# Patient Record
Sex: Male | Born: 1985 | Race: White | Hispanic: No | Marital: Married | Smoking: Never smoker
Health system: Southern US, Community
[De-identification: ages and names within clinical notes are randomized; demographics above are authoritative.]

## PROBLEM LIST (undated history)

## (undated) DIAGNOSIS — T7840XA Allergy, unspecified, initial encounter: Secondary | ICD-10-CM

## (undated) HISTORY — PX: VASECTOMY: SHX75

## (undated) HISTORY — DX: Allergy, unspecified, initial encounter: T78.40XA

## (undated) HISTORY — PX: HERNIA REPAIR: SHX51

---
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
## 2003-03-26 ENCOUNTER — Emergency Department (HOSPITAL_COMMUNITY): Admission: EM | Admit: 2003-03-26 | Discharge: 2003-03-27 | Payer: Self-pay

## 2003-03-27 ENCOUNTER — Encounter: Payer: Self-pay | Admitting: Emergency Medicine

## 2003-07-09 ENCOUNTER — Encounter: Admission: RE | Admit: 2003-07-09 | Discharge: 2003-07-09 | Payer: Self-pay | Admitting: Family Medicine

## 2003-07-09 ENCOUNTER — Encounter: Payer: Self-pay | Admitting: Family Medicine

## 2005-04-11 ENCOUNTER — Encounter: Admission: RE | Admit: 2005-04-11 | Discharge: 2005-04-11 | Payer: Self-pay | Admitting: Family Medicine

## 2005-08-31 ENCOUNTER — Encounter: Admission: RE | Admit: 2005-08-31 | Discharge: 2005-08-31 | Payer: Self-pay | Admitting: Family Medicine

## 2006-09-24 ENCOUNTER — Emergency Department (HOSPITAL_COMMUNITY): Admission: EM | Admit: 2006-09-24 | Discharge: 2006-09-24 | Payer: Self-pay | Admitting: Emergency Medicine

## 2007-08-18 ENCOUNTER — Encounter: Admission: RE | Admit: 2007-08-18 | Discharge: 2007-08-18 | Payer: Self-pay | Admitting: Family Medicine

## 2008-09-09 ENCOUNTER — Emergency Department (HOSPITAL_COMMUNITY): Admission: EM | Admit: 2008-09-09 | Discharge: 2008-09-10 | Payer: Self-pay | Admitting: Internal Medicine

## 2008-09-19 ENCOUNTER — Emergency Department (HOSPITAL_BASED_OUTPATIENT_CLINIC_OR_DEPARTMENT_OTHER): Admission: EM | Admit: 2008-09-19 | Discharge: 2008-09-19 | Payer: Self-pay | Admitting: Emergency Medicine

## 2008-11-22 ENCOUNTER — Emergency Department (HOSPITAL_COMMUNITY): Admission: EM | Admit: 2008-11-22 | Discharge: 2008-11-22 | Payer: Self-pay | Admitting: Emergency Medicine

## 2009-11-08 ENCOUNTER — Encounter: Admission: RE | Admit: 2009-11-08 | Discharge: 2009-11-08 | Payer: Self-pay | Admitting: Surgery

## 2010-12-24 ENCOUNTER — Encounter: Payer: Self-pay | Admitting: Surgery

## 2011-09-04 LAB — POCT I-STAT, CHEM 8
BUN: 14
Calcium, Ion: 1.2
TCO2: 27

## 2011-09-04 LAB — DIFFERENTIAL
Basophils Absolute: 0
Basophils Relative: 0
Eosinophils Relative: 2
Monocytes Absolute: 0.5

## 2011-09-04 LAB — COMPREHENSIVE METABOLIC PANEL
ALT: 11
AST: 21
Albumin: 4.2
Alkaline Phosphatase: 50
Chloride: 108
GFR calc Af Amer: >60
Potassium: 3.9
Total Bilirubin: 0.6

## 2011-09-04 LAB — URINALYSIS, ROUTINE W REFLEX MICROSCOPIC
Bilirubin Urine: NEGATIVE
Glucose, UA: NEGATIVE
Hgb urine dipstick: NEGATIVE
Ketones, ur: NEGATIVE
Ketones, ur: NEGATIVE
Leukocytes, UA: NEGATIVE
Protein, ur: NEGATIVE
Protein, ur: NEGATIVE
Urobilinogen, UA: 1

## 2011-09-04 LAB — URINE CULTURE
Colony Count: NO GROWTH
Culture: NO GROWTH

## 2011-09-04 LAB — CBC
Platelets: 168
WBC: 7.6

## 2011-09-04 LAB — URINE MICROSCOPIC-ADD ON

## 2014-03-12 ENCOUNTER — Other Ambulatory Visit: Payer: Self-pay | Admitting: Family Medicine

## 2014-03-12 DIAGNOSIS — N50811 Right testicular pain: Secondary | ICD-10-CM

## 2014-03-18 ENCOUNTER — Other Ambulatory Visit: Payer: Self-pay | Admitting: Family Medicine

## 2014-03-18 ENCOUNTER — Ambulatory Visit
Admission: RE | Admit: 2014-03-18 | Discharge: 2014-03-18 | Disposition: A | Discharge status: Home / Self Care | Payer: 59 | Source: Ambulatory Visit | Attending: Family Medicine | Admitting: Family Medicine

## 2014-03-18 DIAGNOSIS — N50811 Right testicular pain: Secondary | ICD-10-CM

## 2015-03-31 ENCOUNTER — Other Ambulatory Visit (HOSPITAL_COMMUNITY): Payer: Self-pay | Admitting: Orthopedic Surgery

## 2015-03-31 ENCOUNTER — Ambulatory Visit (HOSPITAL_COMMUNITY)
Admission: RE | Admit: 2015-03-31 | Discharge: 2015-03-31 | Disposition: A | Discharge status: Home / Self Care | Payer: 59 | Source: Ambulatory Visit | Attending: Orthopedic Surgery | Admitting: Orthopedic Surgery

## 2015-03-31 DIAGNOSIS — Z01818 Encounter for other preprocedural examination: Secondary | ICD-10-CM | POA: Diagnosis not present

## 2015-03-31 DIAGNOSIS — M25512 Pain in left shoulder: Secondary | ICD-10-CM

## 2016-01-31 ENCOUNTER — Other Ambulatory Visit: Payer: Self-pay | Admitting: Family Medicine

## 2016-01-31 ENCOUNTER — Ambulatory Visit
Admission: RE | Admit: 2016-01-31 | Discharge: 2016-01-31 | Disposition: A | Discharge status: Home / Self Care | Payer: 59 | Source: Ambulatory Visit | Attending: Family Medicine | Admitting: Family Medicine

## 2016-01-31 DIAGNOSIS — R059 Cough, unspecified: Secondary | ICD-10-CM

## 2016-01-31 DIAGNOSIS — R05 Cough: Secondary | ICD-10-CM

## 2016-08-24 ENCOUNTER — Ambulatory Visit (INDEPENDENT_AMBULATORY_CARE_PROVIDER_SITE_OTHER): Payer: 59 | Admitting: Family Medicine

## 2016-08-24 ENCOUNTER — Ambulatory Visit (INDEPENDENT_AMBULATORY_CARE_PROVIDER_SITE_OTHER): Payer: 59

## 2016-08-24 ENCOUNTER — Encounter: Payer: Self-pay | Admitting: Family Medicine

## 2016-08-24 VITALS — BP 128/86 | HR 85 | Ht 71.0 in | Wt 195.4 lb

## 2016-08-24 DIAGNOSIS — M25521 Pain in right elbow: Secondary | ICD-10-CM | POA: Diagnosis not present

## 2016-08-24 DIAGNOSIS — M7021 Olecranon bursitis, right elbow: Secondary | ICD-10-CM | POA: Diagnosis not present

## 2016-08-24 MED ORDER — PREDNISONE 10 MG PO TABS: ORAL_TABLET | ORAL | 0 refills | Status: DC

## 2016-08-24 NOTE — Progress Notes (Signed)
Subjective:  Patient ID: Derrick Krueger, male    DOB: 12/27/1985  Age: 30 y.o. MRN: [id number]  CC: New Patient (Initial Visit) (pt here for elbow pain for the past few months, he has tried otc anti-inflammatories, rest and ice but nothing really works. 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 He has never had any injury to the right elbow.)   HPI Derrick Krueger presents for Patient has been working out trying to condition himself for a new position. He is concerned that this pain may make it difficult for him to perform at the level required of the new position. He continues to have pain and points to the right olecranon region. No relief with over-the-counter anti-inflammatories. No relief with icing it. He has no known injury. He does not do a lot of of extension against resistance as part of his job or his workouts.  History Derrick Krueger has a past medical history of Allergy.   He has a past surgical history that includes Hernia repair.   His family history is not on file.He reports that he has never smoked. He has never used smokeless tobacco. He reports that he drinks alcohol. He reports that he does not use drugs.  No current outpatient prescriptions on file prior to visit.   No current facility-administered medications on file prior to visit.     ROS Review of Systems  Constitutional: Negative for chills, diaphoresis and fever.  HENT: Negative for rhinorrhea and sore throat.   Respiratory: Negative for cough and shortness of breath.   Cardiovascular: Negative for chest pain.  Gastrointestinal: Negative for abdominal pain.  Musculoskeletal: Positive for arthralgias and joint swelling. Negative for myalgias.  Skin: Negative for rash.  Neurological: Negative for weakness and headaches.    Objective:  BP 128/86   Pulse 85   Ht 5\' 11"  (1.803 m)   Wt 195 lb 6 oz (88.6 kg)   BMI 27.25 kg/m   Physical Exam  Constitutional: He is oriented to person, place, and time.  He appears well-developed and well-nourished.  HENT:  Head: Normocephalic and atraumatic.  Right Ear: Tympanic membrane and external ear normal. No decreased hearing is noted.  Left Ear: Tympanic membrane and external ear normal. No decreased hearing is noted.  Mouth/Throat: No oropharyngeal exudate or posterior oropharyngeal erythema.  Eyes: Pupils are equal, round, and reactive to light.  Neck: Normal range of motion. Neck supple.  Cardiovascular: Normal rate and regular rhythm.   No murmur heard. Pulmonary/Chest: Breath sounds normal. No respiratory distress.  Abdominal: Soft. Bowel sounds are normal. He exhibits no mass. There is no tenderness.  Musculoskeletal: Normal range of motion. He exhibits tenderness (tenderness at the medial aspect of the olecranon on the right. Tender for compression of medial triceps insertion.).  Neurological: He is alert and oriented to person, place, and time.  Vitals reviewed.   Assessment & Plan:   Derrick Krueger was seen today for new patient (initial visit).  Diagnoses and all orders for this visit:  Bursitis of elbow, right -     Ambulatory referral to Physical Therapy  Elbow pain, right -     DG Elbow 2 Views Right; Future  Other orders -     predniSONE (DELTASONE) 10 MG tablet; Take 5 daily for 3 days followed by 4,3,2 and 1 for 3 days each.   I am having Derrick Krueger start on predniSONE.  Meds ordered this encounter  Medications  . predniSONE (DELTASONE) 10  MG tablet    Sig: Take 5 daily for 3 days followed by 4,3,2 and 1 for 3 days each.    Dispense:  45 tablet    Refill:  0   X-ray shows no evidence for fracture. No arthritis. Exam is not consistent with tear  Follow-up: Return in about 6 weeks (around 10/05/2016) for Elbow and wellness.  Mechele ClaudeWarren Kirsten Mckone, M.D.

## 2016-09-04 ENCOUNTER — Ambulatory Visit: Payer: 59 | Attending: Family Medicine | Admitting: Physical Therapy

## 2016-09-04 DIAGNOSIS — M25521 Pain in right elbow: Secondary | ICD-10-CM | POA: Insufficient documentation

## 2016-09-04 NOTE — Therapy (Signed)
[facility] [address] [place], [place], [address] Phone: [phone]   Fax:  [phone]  Physical Therapy Evaluation  Patient Details  Name: Derrick Downsathan W Bettendorf MRN: [id number] Date of Birth: 12/13/1985 Referring Provider: Mechele ClaudeWarren Stacks MD.  Encounter Date: 09/04/2016      PT End of Session - 09/04/16 1330    Visit Number 1   Number of Visits 12   Date for PT Re-Evaluation 10/16/16   PT Start Time 1120   PT Stop Time 1212   PT Time Calculation (min) 52 min   Activity Tolerance Patient tolerated treatment well   Behavior During Therapy [facility] for tasks assessed/performed      Past Medical History:  Diagnosis Date  . 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 HERNIA REPAIR     pt thinks it was in 2008    There were no vitals filed for this visit.       Subjective Assessment - 09/04/16 1316    Subjective The patient presents to to outpatient physical therapy with c/o right elbow pain that has been ongoing for ~ 2 months. He reports that the pain started after heavy lifting.  He notes increased elbow pain after using his zero turn mower.  His pain is not specifically localized though he is tender near his medial epicondyle.  He reports heat and rest decrease his pain. and lifting/pushinh heavy weights increase his pain.  He reports no cervical injury though he had an MRI to this region years ago.  He also has a h/o of bilateral shoulder pain.  He reports left elbow pain as well though not nearly as painful as the right.  His pain-level at rest today is a 3/10 but it can rise to much higher levels (6-7+/10).   Limitations --  Lifting.   Patient Stated Goals Get out of pain.   Currently in Pain? Yes   Pain Score 3    Pain Location Elbow   Pain Orientation Right   Pain Descriptors / Indicators Aching;Nagging   Pain Type Acute pain   Pain Onset More than a month ago   Pain Frequency Constant   Aggravating Factors  Lifting and pushing heavy weight.   Pain Relieving Factors Heat and rest.            [facility] PT Assessment - 09/04/16 0001      Assessment   Medical Diagnosis Right elbow bursitis.   Referring Provider Mechele ClaudeWarren Stacks MD.   Onset Date/Surgical Date --  2 months.     Precautions   Precautions None     Restrictions   Weight Bearing Restrictions No     Balance Screen   Has the patient fallen in the past 6 months No   Has the patient had a decrease in activity level because of a fear of falling?  No   Is the patient reluctant to leave their home because of a fear of falling?  No     Home Environment   Living Environment Private residence     Prior Function   Level of Independence Independent     Observation/Other Assessments   Observations Negative Tennis elbow test for lateral and medial epicondylitis.  Negative Tinel's test at right medial elbow.     ROM / Strength   AROM / PROM / Strength AROM;Strength     AROM   Overall AROM Comments Full right elbow  and forearm AROM.  Crepitus with passive endrange right elbow flexion.     Strength   Overall Strength Comments Normal right elbow, forearm and grip strength..     Palpation   Palpation comment Tender to palption over flexor tendons at medical epicondylar attachment.  Although he reports rather diffuse pain radiation (not palpably tender) into triceps region and moving distally into right forearm.     Special Tests    Special Tests --  Normal UE DTR's.                   [facility] Adult PT Treatment/Exercise - 09/04/16 0001      Modalities   Modalities Electrical Stimulation;Iontophoresis     Electrical Stimulation   Electrical Stimulation Location IFC around right elbow.   Electrical Stimulation Action IFC at 100% scan.   Electrical Stimulation Goals Pain     Iontophoresis   Type of Iontophoresis Dexamethasone   Location Medial epicondylar region.   Dose  80 mA-Min.                     PT Long Term Goals - 09/04/16 1351      PT LONG TERM GOAL #1   Title Return to normal lifting activites with right elbow pain not to exceed 2/10.   Time 6   Period Weeks   Status New               Plan - 09/04/16 1341    Clinical Impression Statement The patient presents with reproducible right elbow pain with heavy pushing and pulling.  He is tender to palpation in the area of the medial epicondyle and associated muscular attachment.  Special testing is negative.  When his pain increases (ie:  after using his zero turn mower) it is diffuse in nature encompassing his right elbow with pain moving into the right Tricep region and distally over his forearm.   Rehab Potential Good   PT Frequency 2x / week   PT Duration 6 weeks   PT Treatment/Interventions ADLs/Self Care Home Management;Cryotherapy;Electrical Stimulation;Iontophoresis 4mg /ml Dexamethasone;Moist Heat;Ultrasound;Patient/family education;Therapeutic exercise;Therapeutic activities;Dry needling   PT Next Visit Plan Iontophoresis; Dry needling; STW/M including IASTM over medial and lateral elbow region; electrical stimulation.   Consulted and Agree with Plan of Care Patient      Patient will benefit from skilled therapeutic intervention in order to improve the following deficits and impairments:  Pain, Decreased activity tolerance  Visit Diagnosis: Pain in right elbow - Plan: PT plan of care cert/re-cert     Problem List There are no active problems to display for this patient.   Brayant Dorr, [place] MPT+ 09/04/2016, 1:56 PM  [facility] [address] [place], [place], [address] Phone: [phone]   Fax:  [phone]  Name: KOU GUCCIARDO MRN: [id number] Date of Birth: 1986-05-17

## 2016-09-13 ENCOUNTER — Ambulatory Visit: Payer: 59 | Admitting: *Deleted

## 2016-09-13 DIAGNOSIS — M25521 Pain in right elbow: Secondary | ICD-10-CM

## 2016-09-13 NOTE — Therapy (Signed)
[facility] [address] [place], [place], [address] Phone: [phone]   Fax:  [phone]  Physical Therapy Treatment  Patient Details  Name: Derrick Krueger MRN: [id number] Date of Birth: 07-09-1986 Referring Provider: Mechele Claude MD.  Encounter Date: 09/13/2016      PT End of Session - 09/13/16 1733    Visit Number 2   Number of Visits 12   Date for PT Re-Evaluation 10/16/16   PT Start Time 1645   PT Stop Time 1738   PT Time Calculation (min) 53 min      Past Medical History:  Diagnosis Date  . 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 HERNIA REPAIR     pt thinks it was in 2008    There were no vitals filed for this visit.      Subjective Assessment - 09/13/16 1743    Subjective The patient presents to to outpatient physical therapy with c/o right elbow pain that has been ongoing for ~ 2 months. He reports that the pain started after heavy lifting.  He notes increased elbow pain after using his zero turn mower.  His pain is not specifically localized though he is tender near his medial epicondyle.  He reports heat and rest decrease his pain. and lifting/pushinh heavy weights increase his pain.  He reports no cervical injury though he had an MRI to this region years ago.  He also has a h/o of bilateral shoulder pain.  He reports left elbow pain as well though not nearly as painful as the right.  His pain-level at rest today is a 3/10 but it can rise to much higher levels (6-7+/10).   Limitations Lifting   Patient Stated Goals Get out of pain.   Currently in Pain? Yes   Pain Score 4    Pain Location Elbow   Pain Orientation Right   Pain Descriptors / Indicators Aching;Nagging   Pain Type Acute pain   Pain Onset More than a month ago   Pain Frequency Constant                         [facility] Adult PT Treatment/Exercise - 09/13/16 0001      Modalities   Modalities Electrical Stimulation;Iontophoresis;Ultrasound     Programme researcher, broadcasting/film/video Location IFC around right elbow. 80-150hz  x 15 mins   Electrical Stimulation Goals Pain     Ultrasound   Ultrasound Location medial and lateral aspect   Ultrasound Parameters 1.5 w/cm2 x 16 mins ( 8 each side) 3.3 mhz   Ultrasound Goals Pain     Iontophoresis   Type of Iontophoresis Dexamethasone   Location Medial epicondylar region. 2/6   Dose 80 mA-Min.                     PT Long Term Goals - 09/04/16 1351      PT LONG TERM GOAL #1   Title Return to normal lifting activites with right elbow pain not to exceed 2/10.   Time 6   Period Weeks   Status New               Plan - 09/13/16 1734    Clinical Impression Statement Pt  did fairly weel with Rx today. He was having pain in RT elbow on the medial and lateral aspect today. He has pain  with heavy lifting and at rest. No complaints during Rx today and had normal modality responses.   Rehab Potential Good   PT Frequency 2x / week   PT Duration 6 weeks   PT Treatment/Interventions ADLs/Self Care Home Management;Cryotherapy;Electrical Stimulation;Iontophoresis 4mg /ml Dexamethasone;Moist Heat;Ultrasound;Patient/family education;Therapeutic exercise;Therapeutic activities;Dry needling   PT Next Visit Plan Iontophoresis; Dry needling; STW/M including IASTM over medial and lateral elbow region; electrical stimulation.      Patient will benefit from skilled therapeutic intervention in order to improve the following deficits and impairments:  Pain, Decreased activity tolerance  Visit Diagnosis: Pain in right elbow     Problem List There are no active problems to display for this patient.   Elika Godar,CHRIS, PTA 09/13/2016, 5:47 PM  [facility] [address] [place], [place], [address] Phone: [phone]   Fax:  [phone]  Name:  Corky Downsathan W Sehgal MRN: [id number] Date of Birth: 02/26/1986

## 2016-09-18 ENCOUNTER — Ambulatory Visit: Payer: 59 | Admitting: Physical Therapy

## 2016-09-18 DIAGNOSIS — M25521 Pain in right elbow: Secondary | ICD-10-CM

## 2016-09-18 NOTE — Therapy (Signed)
[facility] [address] [place], [place], [address] Phone: [phone]   Fax:  [phone]  Physical Therapy Treatment  Patient Details  Name: Derrick Krueger Aristizabal MRN: [id number] Date of Birth: 03/28/1986 Referring Provider: Mechele ClaudeWarren Stacks MD.  Encounter Date: 09/18/2016      PT End of Session - 09/18/16 1801    Visit Number 3   Number of Visits 12   Date for PT Re-Evaluation 10/16/16   PT Start Time 0443   PT Stop Time 0542   PT Time Calculation (min) 59 min   Activity Tolerance Patient tolerated treatment well   Behavior During Therapy [facility] for tasks assessed/performed      Past Medical History:  Diagnosis Date  . 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 HERNIA REPAIR     pt thinks it was in 2008    There were no vitals filed for this visit.      Subjective Assessment - 09/18/16 1757    Subjective Patient states he has been doing better since his last treatment with a pain-level of 2/10 today.  He also states he has been busy at work 'twisting wrenches."   Pain Score 2    Pain Location Elbow   Pain Orientation Right   Pain Descriptors / Indicators Aching;Nagging   Pain Type Acute pain   Pain Onset More than a month ago                         [facility] Treatment/Exercise - 09/18/16 0001      Modalities   Modalities Electrical Stimulation     Electrical Stimulation   Electrical Stimulation Location Medial/lateral right elbow region.   Electrical Stimulation Action IFC at 80-150 Hz x 20 minutes at 100% scan.   Electrical Stimulation Goals Pain     Ultrasound   Ultrasound Location Medial/lateral aspect   Ultrasound Parameters 1.50 Krueger/CM2 at 1.50 x 16 minutes toatl at 3.3Mhz at 50%.     Iontophoresis   Type of Iontophoresis Dexamethasone   Location Medial epicondyle   Dose 80 mA-Min                     PT Long  Term Goals - 09/04/16 1351      PT LONG TERM GOAL #1   Title Return to normal lifting activites with right elbow pain not to exceed 2/10.   Time 6   Period Weeks   Status New             Patient will benefit from skilled therapeutic intervention in order to improve the following deficits and impairments:  Pain, Decreased activity tolerance  Visit Diagnosis: Pain in right elbow     Problem List There are no active problems to display for this patient.   Morse Brueggemann, [place] MPT 09/18/2016, 6:03 PM  [facility] [address] [place], [place], [address] Phone: [phone]   Fax:  [phone]  Name: Derrick Krueger Stansbery MRN: [id number] Date of Birth: 06/29/1986

## 2016-09-27 ENCOUNTER — Ambulatory Visit: Payer: 59 | Admitting: *Deleted

## 2016-09-27 DIAGNOSIS — M25521 Pain in right elbow: Secondary | ICD-10-CM | POA: Diagnosis not present

## 2016-09-27 NOTE — Therapy (Signed)
[facility] [address] [place], [place], [address] Phone: [phone]   Fax:  [phone]  Physical Therapy Treatment  Patient Details  Name: Derrick Krueger MRN: [id number] Date of Birth: 07/17/1986 Referring Provider: Mechele ClaudeWarren Stacks MD.  Encounter Date: 09/27/2016      PT End of Session - 09/27/16 1719    Visit Number 4   Number of Visits 12   Date for PT Re-Evaluation 10/16/16   PT Start Time 1645   PT Stop Time 1732   PT Time Calculation (min) 47 min      Past Medical History:  Diagnosis Date  . 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 HERNIA REPAIR     pt thinks it was in 2008    There were no vitals filed for this visit.                       [facility] Adult PT Treatment/Exercise - 09/27/16 0001      Modalities   Modalities Electrical Stimulation     Electrical Stimulation   Electrical Stimulation Location IFC around right elbow. 80-150hz  x 15 mins   Electrical Stimulation Goals Pain     Ultrasound   Ultrasound Location medial/lateral elbow   Ultrasound Parameters 1.5 w/cm2 , 3.523mhz,  x 16  (8mins each) mins   Ultrasound Goals Pain     Iontophoresis   Type of Iontophoresis Dexamethasone   Location Medial epicondyle   Dose 80 mA-Min                     PT Long Term Goals - 09/04/16 1351      PT LONG TERM GOAL #1   Title Return to normal lifting activites with right elbow pain not to exceed 2/10.   Time 6   Period Weeks   Status New               Plan - 09/27/16 1721    Clinical Impression Statement Pt did fairly well today with Rx.  He had a little more soreness today due to passing the football and doing some yardwork. He feels that overall it is getting better with less pain, but continues to have pain with certain act.'s. LTGs are ongoing   Rehab Potential Good   PT Frequency 2x / week   PT  Duration 6 weeks   PT Treatment/Interventions ADLs/Self Care Home Management;Cryotherapy;Electrical Stimulation;Iontophoresis 4mg /ml Dexamethasone;Moist Heat;Ultrasound;Patient/family education;Therapeutic exercise;Therapeutic activities;Dry needling   Consulted and Agree with Plan of Care Patient      Patient will benefit from skilled therapeutic intervention in order to improve the following deficits and impairments:  Pain, Decreased activity tolerance  Visit Diagnosis: Pain in right elbow     Problem List There are no active problems to display for this patient.   Lucie Friedlander,CHRIS, PTA 09/27/2016, 5:49 PM  [facility] [address] [place], [place], [address] Phone: [phone]   Fax:  [phone]  Name: Derrick Krueger MRN: [id number] Date of Birth: 07/24/1986

## 2016-10-02 ENCOUNTER — Ambulatory Visit: Payer: 59 | Admitting: Physical Therapy

## 2016-10-02 DIAGNOSIS — M25521 Pain in right elbow: Secondary | ICD-10-CM

## 2016-10-02 NOTE — Therapy (Addendum)
[facility] [place], [place], [address] Phone: [phone]   Fax:  [phone]  Physical Therapy Treatment  Patient Details  Name: Derrick Krueger MRN: [id number] Date of Birth: 1986/04/13 Referring Provider: Claretta Fraise MD.  Encounter Date: 10/02/2016    Past Medical History:  Diagnosis Date  . 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 HERNIA REPAIR     pt thinks it was in 2008    There were no vitals filed for this visit.      Subjective Assessment - 10/02/16 1805    Subjective My elbow is feeling better.  Most pain is in back now.   Patient Stated Goals Get out of pain.   Pain Score 2    Pain Location Elbow   Pain Orientation Right   Pain Descriptors / Indicators Aching   Pain Type Acute pain   Pain Onset More than a month ago                         [facility] Adult PT Treatment/Exercise - 10/02/16 0001      Modalities   Modalities Electrical Stimulation;Moist Heat     Moist Heat Therapy   Number Minutes Moist Heat 15 Minutes   Moist Heat Location --  RT ELBOW.     Acupuncturist Location RT DISTAL TRICEP REGION.   Electrical Stimulation Action Pre-mod constant at 80-150 Hz x 15 minutes.   Electrical Stimulation Goals Pain     Ultrasound   Ultrasound Location Distal right tricep region.   Ultrasound Parameters 1.2 W/CM2 x 8 minutes.   Ultrasound Goals Pain     Iontophoresis   Type of Iontophoresis Dexamethasone   Location Right distal tricep region.   Dose 80 mA-Min.     Manual Therapy   Manual Therapy Soft tissue mobilization   Manual therapy comments IASTM x 7 minutes.                     PT Long Term Goals - 09/04/16 1351      PT LONG TERM GOAL #1   Title Return to normal lifting activites with right elbow pain not to exceed 2/10.   Time 6   Period Weeks   Status New               Plan - 10/02/16 1810    Clinical Impression Statement Patient reporting a consistent decrease in right elbow pain.  His pain was localized to the right distal tricep region today.   Rehab Potential Good   PT Frequency 2x / week   PT Duration 6 weeks   PT Treatment/Interventions ADLs/Self Care Home Management;Cryotherapy;Electrical Stimulation;Iontophoresis 70m/ml Dexamethasone;Moist Heat;Ultrasound;Patient/family education;Therapeutic exercise;Therapeutic activities;Dry needling   PT Next Visit Plan Begin UBE and right UE strengthening.   Consulted and Agree with Plan of Care Patient      Patient will benefit from skilled therapeutic intervention in order to improve the following deficits and impairments:  Pain, Decreased activity tolerance  Visit Diagnosis: Pain in right elbow     Problem List There are no active problems to display for this patient.   Berania Peedin, CMaliMPT 10/02/2016, 6:14 PM  [facility] [address]Phone: [phone]  Fax:  [phone] Name: Derrick SHERKMRN: [id number] of Birth:  Mar 03, 1986   PHYSICAL THERAPY DISCHARGE SUMMARY  Visits from Start of Care:   Current functional level related to goals / functional outcomes: See above.   Remaining deficits: Pt did not return.   Education / Equipment: HEP.  Plan: Patient agrees to discharge.  Patient goals were not met. Patient is being discharged due to not returning since the last visit.  ?????         Derrick Krueger MPT

## 2016-12-18 ENCOUNTER — Telehealth: Payer: Self-pay | Admitting: Family Medicine

## 2016-12-19 ENCOUNTER — Encounter: Payer: 59 | Admitting: Family Medicine

## 2016-12-21 ENCOUNTER — Encounter: Payer: 59 | Admitting: Family

## 2016-12-21 NOTE — Telephone Encounter (Signed)
Pt no longer needs appt.

## 2019-01-10 ENCOUNTER — Emergency Department (HOSPITAL_BASED_OUTPATIENT_CLINIC_OR_DEPARTMENT_OTHER)
Admission: EM | Admit: 2019-01-10 | Discharge: 2019-01-10 | Disposition: A | Discharge status: Home / Self Care | Payer: 59 | Attending: Emergency Medicine | Admitting: Emergency Medicine

## 2019-01-10 ENCOUNTER — Encounter (HOSPITAL_BASED_OUTPATIENT_CLINIC_OR_DEPARTMENT_OTHER): Payer: Self-pay | Admitting: *Deleted

## 2019-01-10 ENCOUNTER — Emergency Department (HOSPITAL_BASED_OUTPATIENT_CLINIC_OR_DEPARTMENT_OTHER): Payer: 59

## 2019-01-10 ENCOUNTER — Other Ambulatory Visit: Payer: Self-pay

## 2019-01-10 DIAGNOSIS — J111 Influenza due to unidentified influenza virus with other respiratory manifestations: Secondary | ICD-10-CM | POA: Diagnosis not present

## 2019-01-10 DIAGNOSIS — R0981 Nasal congestion: Secondary | ICD-10-CM | POA: Diagnosis not present

## 2019-01-10 DIAGNOSIS — R112 Nausea with vomiting, unspecified: Secondary | ICD-10-CM | POA: Diagnosis not present

## 2019-01-10 DIAGNOSIS — R0602 Shortness of breath: Secondary | ICD-10-CM | POA: Insufficient documentation

## 2019-01-10 DIAGNOSIS — R69 Illness, unspecified: Secondary | ICD-10-CM

## 2019-01-10 DIAGNOSIS — M791 Myalgia, unspecified site: Secondary | ICD-10-CM | POA: Diagnosis not present

## 2019-01-10 DIAGNOSIS — R509 Fever, unspecified: Secondary | ICD-10-CM | POA: Diagnosis present

## 2019-01-10 MED ORDER — BENZONATATE 100 MG PO CAPS: 100.0000 mg | ORAL_CAPSULE | Freq: Three times a day (TID) | ORAL | 0 refills | Status: DC

## 2019-01-10 MED ORDER — ACETAMINOPHEN 500 MG PO TABS
1000.0000 mg | ORAL_TABLET | Freq: Once | ORAL | Status: AC
  Administered 2019-01-10: 1000 mg via ORAL
  Filled 2019-01-10: qty 2

## 2019-01-10 MED ORDER — KETOROLAC TROMETHAMINE 15 MG/ML IJ SOLN
15.0000 mg | Freq: Once | INTRAMUSCULAR | Status: AC
  Administered 2019-01-10: 15 mg via INTRAMUSCULAR
  Filled 2019-01-10: qty 1

## 2019-01-10 MED ORDER — ONDANSETRON 4 MG PO TBDP: ORAL_TABLET | ORAL | 0 refills | Status: DC

## 2019-01-10 MED ORDER — SODIUM CHLORIDE 0.9 % IV BOLUS
1000.0000 mL | Freq: Once | INTRAVENOUS | Status: AC
  Administered 2019-01-10: 1000 mL via INTRAVENOUS

## 2019-01-10 MED ORDER — BENZONATATE 100 MG PO CAPS
100.0000 mg | ORAL_CAPSULE | Freq: Once | ORAL | Status: AC
  Administered 2019-01-10: 100 mg via ORAL
  Filled 2019-01-10: qty 1

## 2019-01-10 NOTE — ED Notes (Signed)
ED Provider at bedside. 

## 2019-01-10 NOTE — ED Triage Notes (Signed)
Feve, body aches, cough, SOB since yesterday

## 2019-01-10 NOTE — ED Provider Notes (Signed)
[facility] EMERGENCY DEPARTMENT Provider Note   CSN: [id number] Arrival date & time: 01/10/19  2103     History   Chief Complaint Chief Complaint  Patient presents with  . Fever  . Shortness of Breath    HPI Derrick Krueger is a 33 y.o. male.  33 yo M with a chief complaint of fevers chills myalgias cough congestion nausea and vomiting.  This been going on for about 3 days now.  Started having some vomiting just prior to arrival after coughing so hard it made him gag.  Had had no prior nausea vomiting or diarrhea.  Denies abdominal pain.  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  Has been able to tolerate by mouth at home but has had decreased oral intake.  His biggest complaint is he feels that he cannot get a full breath.  The history is provided by the patient.  Fever  Associated symptoms: chills, congestion, cough, nausea and vomiting   Associated symptoms: no chest pain, no confusion, no diarrhea, no headaches, no myalgias and no rash   Shortness of Breath  Associated symptoms: cough, fever and vomiting   Associated symptoms: no abdominal pain, no chest pain, no headaches and no rash   Illness  Severity:  Moderate Onset quality:  Gradual Duration:  3 days Timing:  Constant Progression:  Worsening Chronicity:  New Associated symptoms: congestion, cough, fever, nausea, shortness of breath and vomiting   Associated symptoms: no abdominal pain, no chest pain, no diarrhea, no headaches, no myalgias and no rash     Past Medical History:  Diagnosis Date  . Allergy    hydrocodone makes him itch terribly    There are no active problems to display for this patient.   Past Surgical History:  Procedure Laterality Date  . HERNIA REPAIR     pt thinks it was in 2008        Home Medications    Prior to Admission medications   Medication Sig Start Date End Date Taking? Authorizing Provider    benzonatate (TESSALON) 100 MG capsule Take 1 capsule (100 mg total) by mouth every 8 (eight) hours. 01/10/19   Melene Plan, DO  ondansetron (ZOFRAN ODT) 4 MG disintegrating tablet 4mg  ODT q4 hours prn nausea/vomit 01/10/19   Melene Plan, DO  predniSONE (DELTASONE) 10 MG tablet Take 5 daily for 3 days followed by 4,3,2 and 1 for 3 days each. 08/24/16   Mechele Claude, MD    Family History No family history on file.  Social History Social History   Tobacco Use  . Smoking status: Never Smoker  . Smokeless tobacco: Never Used  Substance Use Topics  . Alcohol use: Yes    Comment: drinks a beer every now and then  . Drug use: No     Allergies   Hydrocodone   Review of Systems Review of Systems  Constitutional: Positive for chills and fever.  HENT: Positive for congestion. Negative for facial swelling.   Eyes: Negative for discharge and visual disturbance.  Respiratory: Positive for cough and shortness of breath.   Cardiovascular: Negative for chest pain and palpitations.  Gastrointestinal: Positive for nausea and vomiting. Negative for abdominal pain and diarrhea.  Musculoskeletal: Negative for arthralgias and myalgias.  Skin: Negative for color change and rash.  Neurological: Negative for tremors, syncope and headaches.  Psychiatric/Behavioral: Negative for confusion and dysphoric mood.  Physical Exam Updated Vital Signs BP (!) 126/91 (BP Location: Right Arm)   Pulse (!) 115   Temp 99.3 F (37.4 C) (Oral)   Resp 20   Ht 5\' 11"  (1.803 m)   Wt 88.5 kg   SpO2 96%   BMI 27.20 kg/m   Physical Exam Vitals signs and nursing note reviewed.  Constitutional:      Appearance: He is well-developed.  HENT:     Head: Normocephalic and atraumatic.     Comments: Swollen turbinates, posterior nasal drip, no noted sinus ttp, tm normal bilaterally.   Eyes:     Pupils: Pupils are equal, round, and reactive to light.  Neck:     Musculoskeletal: Normal range of motion and neck  supple.     Vascular: No JVD.  Cardiovascular:     Rate and Rhythm: Normal rate and regular rhythm.     Heart sounds: No murmur. No friction rub. No gallop.   Pulmonary:     Effort: No respiratory distress.     Breath sounds: No wheezing.  Abdominal:     General: There is no distension.     Tenderness: There is no guarding or rebound.  Musculoskeletal: Normal range of motion.  Skin:    Coloration: Skin is not pale.     Findings: No rash.  Neurological:     Mental Status: He is alert and oriented to person, place, and time.  Psychiatric:        Behavior: Behavior normal.      ED Treatments / Results  Labs (all labs ordered are listed, but only abnormal results are displayed) Labs Reviewed - No data to display  EKG None  Radiology Dg Chest 2 View  Result Date: 01/10/2019 CLINICAL DATA:  Fever, headache, cough, shortness of breath, symptoms for 48 hours, nonsmoker EXAM: CHEST - 2 VIEW COMPARISON:  01/31/2016 FINDINGS: Mild enlargement of cardiac silhouette. Mediastinal contours and pulmonary vascularity normal. Minimal subsegmental atelectasis at LEFT base. Lungs otherwise clear. No pulmonary infiltrate, pleural effusion, or pneumothorax. Bones unremarkable. IMPRESSION: Minimal subsegmental atelectasis at lingula. Mild enlargement of cardiac silhouette. Electronically Signed   By: Ulyses SouthwardMark  Boles M.D.   On: 01/10/2019 22:35    Procedures Procedures (including critical care time)  Medications Ordered in ED Medications  sodium chloride 0.9 % bolus 1,000 mL (1,000 mLs Intravenous New Bag/Given 01/10/19 2154)  acetaminophen (TYLENOL) tablet 1,000 mg (1,000 mg Oral Given 01/10/19 2154)  ketorolac (TORADOL) 15 MG/ML injection 15 mg (15 mg Intramuscular Given 01/10/19 2155)  benzonatate (TESSALON) capsule 100 mg (100 mg Oral Given 01/10/19 2157)     Initial Impression / Assessment and Plan / ED Course  I have reviewed the triage vital signs and the nursing notes.  Pertinent labs &  imaging results that were available during my care of the patient were reviewed by me and considered in my medical decision making (see chart for details).     33 yo M with a chief complaints of an influenza like illness.  Going on for the past 3 to 4 days.  The patient appears to be dehydrated.  We will give a bolus of fluids will give Tylenol and Toradol.  With his symptoms are short of breath will obtain a chest x-ray.  Lungs are clear on my exam.  No bacterial source is found.  Chest x-ray reviewed by me without focal infiltrate.  Patient feeling much better on my reassessment.  D/c home.   10:55 PM:  I have discussed  the diagnosis/risks/treatment options with the patient and family and believe the pt to be eligible for discharge home to follow-up with PCP. We also discussed returning to the ED immediately if new or worsening sx occur. We discussed the sx which are most concerning (e.g., sudden worsening pain, fever, inability to tolerate by mouth) that necessitate immediate return. Medications administered to the patient during their visit and any new prescriptions provided to the patient are listed below.  Medications given during this visit Medications  sodium chloride 0.9 % bolus 1,000 mL (1,000 mLs Intravenous New Bag/Given 01/10/19 2154)  acetaminophen (TYLENOL) tablet 1,000 mg (1,000 mg Oral Given 01/10/19 2154)  ketorolac (TORADOL) 15 MG/ML injection 15 mg (15 mg Intramuscular Given 01/10/19 2155)  benzonatate (TESSALON) capsule 100 mg (100 mg Oral Given 01/10/19 2157)     The patient appears reasonably screen and/or stabilized for discharge and I doubt any other medical condition or other [facility] requiring further screening, evaluation, or treatment in the ED at this time prior to discharge.    Final Clinical Impressions(s) / ED Diagnoses   Final diagnoses:  Influenza-like illness    ED Discharge Orders         Ordered    benzonatate (TESSALON) 100 MG capsule  Every 8 hours     01/10/19  2253    ondansetron (ZOFRAN ODT) 4 MG disintegrating tablet     01/10/19 2253           Melene PlanFloyd, Alesana Magistro, DO 01/10/19 2255

## 2019-01-10 NOTE — Discharge Instructions (Signed)
Take tylenol 2 pills 4 times a day and motrin 4 pills 3 times a day.  Drink plenty of fluids.  Return for worsening shortness of breath, headache, confusion. Follow up with your family doctor.   

## 2020-04-28 ENCOUNTER — Emergency Department (HOSPITAL_COMMUNITY): Payer: 59

## 2020-04-28 ENCOUNTER — Encounter (HOSPITAL_COMMUNITY): Payer: Self-pay

## 2020-04-28 ENCOUNTER — Other Ambulatory Visit: Payer: Self-pay

## 2020-04-28 ENCOUNTER — Emergency Department (HOSPITAL_COMMUNITY)
Admission: EM | Admit: 2020-04-28 | Discharge: 2020-04-28 | Disposition: A | Discharge status: Home / Self Care | Payer: 59 | Attending: Emergency Medicine | Admitting: Emergency Medicine

## 2020-04-28 DIAGNOSIS — N451 Epididymitis: Secondary | ICD-10-CM | POA: Insufficient documentation

## 2020-04-28 DIAGNOSIS — R109 Unspecified abdominal pain: Secondary | ICD-10-CM | POA: Diagnosis not present

## 2020-04-28 DIAGNOSIS — N5082 Scrotal pain: Secondary | ICD-10-CM | POA: Diagnosis present

## 2020-04-28 LAB — URINALYSIS, ROUTINE W REFLEX MICROSCOPIC
Bacteria, UA: NONE SEEN
Bilirubin Urine: NEGATIVE
Glucose, UA: NEGATIVE mg/dL
Hgb urine dipstick: NEGATIVE
Ketones, ur: 5 mg/dL — AB
Leukocytes,Ua: NEGATIVE
Nitrite: NEGATIVE
Protein, ur: NEGATIVE mg/dL
Specific Gravity, Urine: 1.02 (ref 1.005–1.030)
pH: 5 (ref 5.0–8.0)

## 2020-04-28 LAB — COMPREHENSIVE METABOLIC PANEL
ALT: 28 U/L (ref 0–44)
AST: 60 U/L — ABNORMAL HIGH (ref 15–41)
Albumin: 5.2 g/dL — ABNORMAL HIGH (ref 3.5–5.0)
Alkaline Phosphatase: 51 U/L (ref 38–126)
Anion gap: 12 (ref 5–15)
BUN: 21 mg/dL — ABNORMAL HIGH (ref 6–20)
CO2: 24 mmol/L (ref 22–32)
Calcium: 9.8 mg/dL (ref 8.9–10.3)
Chloride: 104 mmol/L (ref 98–111)
Creatinine, Ser: 1.03 mg/dL (ref 0.61–1.24)
GFR calc Af Amer: >60 mL/min (ref >60)
GFR calc non Af Amer: >60 mL/min (ref >60)
Glucose, Bld: 84 mg/dL (ref 70–99)
Potassium: 3.8 mmol/L (ref 3.5–5.1)
Sodium: 140 mmol/L (ref 135–145)
Total Bilirubin: 0.9 mg/dL (ref 0.3–1.2)
Total Protein: 7.7 g/dL (ref 6.5–8.1)

## 2020-04-28 LAB — CBC WITH DIFFERENTIAL/PLATELET
Abs Immature Granulocytes: 0.04 10*3/uL (ref 0.00–0.07)
Basophils Absolute: 0 10*3/uL (ref 0.0–0.1)
Basophils Relative: 0 %
Eosinophils Absolute: 0.1 10*3/uL (ref 0.0–0.5)
Eosinophils Relative: 1 %
HCT: 49.1 % (ref 39.0–52.0)
Hemoglobin: 16.3 g/dL (ref 13.0–17.0)
Immature Granulocytes: 0 %
Lymphocytes Relative: 23 %
Lymphs Abs: 2.4 10*3/uL (ref 0.7–4.0)
MCH: 28.6 pg (ref 26.0–34.0)
MCHC: 33.2 g/dL (ref 30.0–36.0)
MCV: 86.3 fL (ref 80.0–100.0)
Monocytes Absolute: 0.6 10*3/uL (ref 0.1–1.0)
Monocytes Relative: 6 %
Neutro Abs: 7.4 10*3/uL (ref 1.7–7.7)
Neutrophils Relative %: 70 %
Platelets: 177 10*3/uL (ref 150–400)
RBC: 5.69 MIL/uL (ref 4.22–5.81)
RDW: 12.8 % (ref 11.5–15.5)
WBC: 10.5 10*3/uL (ref 4.0–10.5)
nRBC: 0 % (ref 0.0–0.2)

## 2020-04-28 LAB — LIPASE, BLOOD: Lipase: 62 U/L — ABNORMAL HIGH (ref 11–51)

## 2020-04-28 MED ORDER — IOHEXOL 300 MG/ML  SOLN
100.0000 mL | Freq: Once | INTRAMUSCULAR | Status: AC | PRN
  Administered 2020-04-28: 100 mL via INTRAVENOUS

## 2020-04-28 MED ORDER — SODIUM CHLORIDE (PF) 0.9 % IJ SOLN
INTRAMUSCULAR | Status: AC
  Filled 2020-04-28: qty 50

## 2020-04-28 MED ORDER — MORPHINE SULFATE (PF) 4 MG/ML IV SOLN
4.0000 mg | Freq: Once | INTRAVENOUS | Status: AC
  Administered 2020-04-28: 4 mg via INTRAVENOUS
  Filled 2020-04-28: qty 1

## 2020-04-28 MED ORDER — ONDANSETRON HCL 4 MG/2ML IJ SOLN
4.0000 mg | Freq: Once | INTRAMUSCULAR | Status: AC
  Administered 2020-04-28: 4 mg via INTRAVENOUS
  Filled 2020-04-28: qty 2

## 2020-04-28 MED ORDER — SODIUM CHLORIDE 0.9 % IV BOLUS
1000.0000 mL | Freq: Once | INTRAVENOUS | Status: AC
  Administered 2020-04-28: 1000 mL via INTRAVENOUS

## 2020-04-28 MED ORDER — CEFTRIAXONE SODIUM 1 G IJ SOLR
500.0000 mg | Freq: Once | INTRAMUSCULAR | Status: AC
  Administered 2020-04-28: 500 mg via INTRAMUSCULAR
  Filled 2020-04-28: qty 10

## 2020-04-28 MED ORDER — DOXYCYCLINE HYCLATE 100 MG PO CAPS: 100.0000 mg | ORAL_CAPSULE | Freq: Two times a day (BID) | ORAL | 0 refills | Status: AC

## 2020-04-28 MED ORDER — STERILE WATER FOR INJECTION IJ SOLN
INTRAMUSCULAR | Status: AC
  Administered 2020-04-28: 1.2 mL
  Filled 2020-04-28: qty 10

## 2020-04-28 NOTE — Consult Note (Signed)
Reason for Consult: Groin pain Referring Physician: Dr. Earline Mayotte is an 34 y.o. male.  HPI: The patient is a 34 year old white male who recently had a vasectomy.  Since the surgery he has been complaining of right groin and testicular pain.  He has a history of bilateral inguinal hernia repairs with mesh in 2010 and does state that he has had some level of discomfort in his right groin since the surgery.  He denies any nausea or vomiting.  He denies any fevers or chills.  His appetite is good and his bowels are moving regularly.  Past Medical History:  Diagnosis Date  . 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 HERNIA REPAIR     pt thinks it was in 2008  . VASECTOMY      History reviewed. No pertinent family history.  Social History:  reports that he has never smoked. He has never used smokeless tobacco. He reports current alcohol use. He reports that he does not use drugs.  Allergies:  Allergies  Allergen Reactions  . Hydrocodone Itching    Medications: I have reviewed the patient's current medications.  Results for orders placed or performed during the hospital encounter of 04/28/20 (from the past 48 hour(s))  CBC with Differential     Status: None   Collection Time: 04/28/20  6:02 PM  Result Value Ref Range   WBC 10.5 4.0 - 10.5 K/uL   RBC 5.69 4.22 - 5.81 MIL/uL   Hemoglobin 16.3 13.0 - 17.0 g/dL   HCT 49.1 39.0 - 52.0 %   MCV 86.3 80.0 - 100.0 fL   MCH 28.6 26.0 - 34.0 pg   MCHC 33.2 30.0 - 36.0 g/dL   RDW 12.8 11.5 - 15.5 %   Platelets 177 150 - 400 K/uL   nRBC 0.0 0.0 - 0.2 %   Neutrophils Relative % 70 %   Neutro Abs 7.4 1.7 - 7.7 K/uL   Lymphocytes Relative 23 %   Lymphs Abs 2.4 0.7 - 4.0 K/uL   Monocytes Relative 6 %   Monocytes Absolute 0.6 0.1 - 1.0 K/uL   Eosinophils Relative 1 %   Eosinophils Absolute 0.1 0.0 - 0.5 K/uL   Basophils Relative 0 %   Basophils Absolute 0.0  0.0 - 0.1 K/uL   Immature Granulocytes 0 %   Abs Immature Granulocytes 0.04 0.00 - 0.07 K/uL    Comment: Performed at [facility], [place] [address] [place], [place] [address]  Comprehensive metabolic panel     Status: Abnormal   Collection Time: 04/28/20  6:02 PM  Result Value Ref Range   Sodium 140 135 - 145 mmol/L   Potassium 3.8 3.5 - 5.1 mmol/L   Chloride 104 98 - 111 mmol/L   CO2 24 22 - 32 mmol/L   Glucose, Bld 84 70 - 99 mg/dL    Comment: Glucose reference range applies only to samples taken after fasting for at least 8 hours.   BUN 21 (H) 6 - 20 mg/dL   Creatinine, Ser 1.03 0.61 - 1.24 mg/dL   Calcium 9.8 8.9 - 10.3 mg/dL   Total Protein 7.7 6.5 - 8.1 g/dL   Albumin 5.2 (H) 3.5 - 5.0 g/dL   AST 60 (H) 15 - 41 U/L   ALT 28 0 - 44 U/L   Alkaline Phosphatase 51 38 - 126 U/L   Total Bilirubin 0.9 0.3 -  1.2 mg/dL   GFR calc non Af Amer >60 >60 mL/min   GFR calc Af Amer >60 >60 mL/min   Anion gap 12 5 - 15    Comment: Performed at [facility], [address] [place], [place] [address]  Lipase, blood     Status: Abnormal   Collection Time: 04/28/20  6:02 PM  Result Value Ref Range   Lipase 62 (H) 11 - 51 U/L    Comment: Performed at [facility], [address] [place], [place] [address]    CT ABDOMEN PELVIS W CONTRAST  Result Date: 04/28/2020 CLINICAL DATA:  Inguinal hernia suspected, triage note states patient reports issues after vasectomy EXAM: CT ABDOMEN AND PELVIS WITH CONTRAST TECHNIQUE: Multidetector CT imaging of the abdomen and pelvis was performed using the standard protocol following bolus administration of intravenous contrast. CONTRAST:  OMNIPAQUE IOHEXOL 300 MG/ML  SOLN COMPARISON:  None. FINDINGS: Lower chest: No acute abnormality. Hepatobiliary: No focal liver abnormality is seen. No gallstones, gallbladder wall thickening, or biliary dilatation. Pancreas: Unremarkable. Spleen: Unremarkable.  Adrenals/Urinary Tract: Adrenals, kidneys, and bladder are unremarkable. Stomach/Bowel: Stomach is within normal limits. Bowel is normal in caliber. Normal appendix. Vascular/Lymphatic: No significant vascular findings are present. No enlarged abdominal or pelvic lymph nodes. Reproductive: Prostate and seminal vesicles are unremarkable. Minimally imaged possible scrotal thickening and edema on the right. Other: No ascites.  No abdominal wall hernia. Musculoskeletal: No significant or acute osseous abnormality. IMPRESSION: Minimally imaged possible right scrotal thickening and edema. Correlate with exam. There is no hernia. Electronically Signed   By: Guadlupe Spanish M.D.   On: 04/28/2020 20:03    Review of Systems  Constitutional: Negative.   HENT: Negative.   Eyes: Negative.   Respiratory: Negative.   Cardiovascular: Negative.   Gastrointestinal: Positive for abdominal pain. Negative for abdominal distention, nausea and vomiting.  Endocrine: Negative.   Genitourinary: Positive for scrotal swelling.  Musculoskeletal: Negative.   Skin: Negative.   Allergic/Immunologic: Negative.   Neurological: Negative.   Hematological: Negative.   Psychiatric/Behavioral: Negative.    Blood pressure 126/79, pulse 65, temperature 98.1 F (36.7 C), temperature source Oral, resp. rate 16, height 5\' 11"  (1.803 m), weight 81.2 kg, SpO2 99 %. Physical Exam  Constitutional: He is oriented to person, place, and time. He appears well-developed and well-nourished. No distress.  HENT:  Head: Normocephalic and atraumatic.  Right Ear: External ear normal.  Left Ear: External ear normal.  Mouth/Throat: Oropharynx is clear and moist.  Eyes: Pupils are equal, round, and reactive to light. Conjunctivae and EOM are normal. No scleral icterus.  Cardiovascular: Normal rate, regular rhythm and normal heart sounds.  No pitting edema of lower extremities  Respiratory: Effort normal and breath sounds normal. No respiratory  distress.  No use of accessory respiratory muscles  GI: Soft.  There is some mild right sided abd tenderness. No palpable mass  Genitourinary:    Genitourinary Comments: The right groin is soft and the cord feels small and thin.  There is no obvious hernia on physical exam.  He is quite tender in the right scrotum at the site of his recent surgery   Musculoskeletal:        General: No tenderness or deformity. Normal range of motion.     Cervical back: Normal range of motion and neck supple.  Lymphadenopathy:    He has no cervical adenopathy.  There is no palpable groin or cervical lymphadenopathy  Neurological: He is alert and  oriented to person, place, and time.  Skin: Skin is warm and dry. No rash noted.  Psychiatric: He has a normal mood and affect. His behavior is normal. Thought content normal.    Assessment/Plan: The patient is having a significant amount of pain after recent vasectomy.  A CT scan done in the emergency department tonight shows no evidence of recurrent hernia.  At this point I would not recommend any surgical intervention from my standpoint.  He may follow-up with his general surgeon that performed his hernia repair as an outpatient.  Chevis Pretty III 04/28/2020, 8:09 PM

## 2020-04-28 NOTE — ED Provider Notes (Signed)
[facility]-EMERGENCY DEPT Provider Note   CSN: [id number] Arrival date & time: 04/28/20  1654     History Chief Complaint  Patient presents with  . abnormal ultrasound    Derrick Krueger is a 34 y.o. male.  HPI     34 year old male with history of vasectomy by Dr. Ronne Binning presents with concern for right scrotal pain with ultrasound performed at Dr. Dimas Millin office with concern for inguinal hernia.  Reports he is had some discomfort in his right scrotum since his vasectomy, however developed acute pain at 7 AM today.  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  Today around 715, while he was at work, he developed sudden sharp pain to the right scrotum.  Reported that the pain radiated to the right lower quadrant and was associated with nausea.  Denies vomiting.  Reports he is passing flatus and had a normal bowel movement today.  Pain is severe.  Denies dysuria.  Past Medical History:  Diagnosis Date  . Allergy    hydrocodone makes him itch terribly    There are no problems to display for this patient.   Past Surgical History:  Procedure Laterality Date  . HERNIA REPAIR     pt thinks it was in 2008  . VASECTOMY         History reviewed. No pertinent family history.  Social History   Tobacco Use  . Smoking status: Never Smoker  . Smokeless tobacco: Never Used  Substance Use Topics  . Alcohol use: Yes    Comment: drinks a beer every now and then  . Drug use: No    Home Medications Prior to Admission medications   Medication Sig Start Date End Date Taking? Authorizing Provider  benzonatate (TESSALON) 100 MG capsule Take 1 capsule (100 mg total) by mouth every 8 (eight) hours. Patient not taking: Reported on 04/28/2020 01/10/19   Melene Plan, DO  doxycycline (VIBRAMYCIN) 100 MG capsule Take 1 capsule (100 mg total) by mouth 2 (two) times daily for 14 days. 04/28/20 05/12/20  Alvira Monday, MD  ondansetron (ZOFRAN  ODT) 4 MG disintegrating tablet 4mg  ODT q4 hours prn nausea/vomit Patient not taking: Reported on 04/28/2020 01/10/19   03/11/19, DO  predniSONE (DELTASONE) 10 MG tablet Take 5 daily for 3 days followed by 4,3,2 and 1 for 3 days each. Patient not taking: Reported on 04/28/2020 08/24/16   08/26/16, MD    Allergies    Hydrocodone  Review of Systems   Review of Systems  Constitutional: Negative for fever.  HENT: Negative for sore throat.   Eyes: Negative for visual disturbance.  Respiratory: Negative for shortness of breath.   Cardiovascular: Negative for chest pain.  Gastrointestinal: Positive for abdominal pain and nausea. Negative for constipation, diarrhea and vomiting.  Genitourinary: Positive for scrotal swelling and testicular pain. Negative for difficulty urinating and flank pain.  Musculoskeletal: Negative for neck stiffness.  Skin: Negative for rash.  Neurological: Negative for syncope and headaches.    Physical Exam Updated Vital Signs BP 123/69 (BP Location: Right Arm)   Pulse 77   Temp 98.1 F (36.7 C) (Oral)   Resp 15   Ht 5\' 11"  (1.803 m)   Wt 81.2 kg   SpO2 97%   BMI 24.97 kg/m   Physical Exam Vitals and nursing note reviewed.  Constitutional:      General: He is not in acute distress.    Appearance: He is well-developed. He  is not diaphoretic.  HENT:     Head: Normocephalic and atraumatic.  Eyes:     Conjunctiva/sclera: Conjunctivae normal.  Cardiovascular:     Rate and Rhythm: Normal rate and regular rhythm.     Heart sounds: Normal heart sounds. No murmur. No friction rub. No gallop.   Pulmonary:     Effort: Pulmonary effort is normal. No respiratory distress.     Breath sounds: Normal breath sounds. No wheezing or rales.  Abdominal:     General: There is no distension.     Palpations: Abdomen is soft.     Tenderness: There is abdominal tenderness (RLQ). There is no guarding.  Genitourinary:    Comments: Significant tenderness diffusely  through right scrotum with swelling Musculoskeletal:     Cervical back: Normal range of motion.  Skin:    General: Skin is warm and dry.  Neurological:     Mental Status: He is alert and oriented to person, place, and time.     ED Results / Procedures / Treatments   Labs (all labs ordered are listed, but only abnormal results are displayed) Labs Reviewed  COMPREHENSIVE METABOLIC PANEL - Abnormal; Notable for the following components:      Result Value   BUN 21 (*)    Albumin 5.2 (*)    AST 60 (*)    All other components within normal limits  LIPASE, BLOOD - Abnormal; Notable for the following components:   Lipase 62 (*)    All other components within normal limits  URINALYSIS, ROUTINE W REFLEX MICROSCOPIC - Abnormal; Notable for the following components:   Color, Urine STRAW (*)    Ketones, ur 5 (*)    All other components within normal limits  CBC WITH DIFFERENTIAL/PLATELET  GC/CHLAMYDIA PROBE AMP ([place]) NOT AT [facility]    EKG None  Radiology CT ABDOMEN PELVIS W CONTRAST  Result Date: 04/28/2020 CLINICAL DATA:  Inguinal hernia suspected, triage note states patient reports issues after vasectomy EXAM: CT ABDOMEN AND PELVIS WITH CONTRAST TECHNIQUE: Multidetector CT imaging of the abdomen and pelvis was performed using the standard protocol following bolus administration of intravenous contrast. CONTRAST:  OMNIPAQUE IOHEXOL 300 MG/ML  SOLN COMPARISON:  None. FINDINGS: Lower chest: No acute abnormality. Hepatobiliary: No focal liver abnormality is seen. No gallstones, gallbladder wall thickening, or biliary dilatation. Pancreas: Unremarkable. Spleen: Unremarkable. Adrenals/Urinary Tract: Adrenals, kidneys, and bladder are unremarkable. Stomach/Bowel: Stomach is within normal limits. Bowel is normal in caliber. Normal appendix. Vascular/Lymphatic: No significant vascular findings are present. No enlarged abdominal or pelvic lymph nodes. Reproductive: Prostate and seminal  vesicles are unremarkable. Minimally imaged possible scrotal thickening and edema on the right. Other: No ascites.  No abdominal wall hernia. Musculoskeletal: No significant or acute osseous abnormality. IMPRESSION: Minimally imaged possible right scrotal thickening and edema. Correlate with exam. There is no hernia. Electronically Signed   By: Guadlupe Spanish M.D.   On: 04/28/2020 20:03    Procedures Procedures (including critical care time)  Medications Ordered in ED Medications  sodium chloride (PF) 0.9 % injection (has no administration in time range)  sodium chloride 0.9 % bolus 1,000 mL (0 mLs Intravenous Stopped 04/28/20 2139)  morphine 4 MG/ML injection 4 mg (4 mg Intravenous Given 04/28/20 1811)  ondansetron (ZOFRAN) injection 4 mg (4 mg Intravenous Given 04/28/20 1811)  iohexol (OMNIPAQUE) 300 MG/ML solution 100 mL (100 mLs Intravenous Contrast Given 04/28/20 1931)  cefTRIAXone (ROCEPHIN) injection 500 mg (500 mg Intramuscular Given 04/28/20 2147)  sterile water (  preservative free) injection (1.2 mLs  Given 04/28/20 2146)   US shows edema  ED Course  I have reviewed the triage vital signs and the nursing notes.  Pertinent labs & imaging results that were available during my care of the patient were reviewed by me and considered in my medical decision making (see chart for details).    MDM Rules/Calculators/A&P                      34 year old male with history of vasectomy by Dr. Alyson Ingles presents with concern for right scrotal pain with ultrasound performed at Dr. Noland Fordyce office with concern for inguinal hernia.  CT abdomen pelvis performed showing no evidence of inguinal hernia and shows scrotal edema.  Discussed with Dr. Alyson Ingles, no sign of other abnormalities on [place], no sign of torsion-recommends treating for epididymitis.   Given rocephin, rx for doxycycline for 2 weeks. Recommend ibuprofen and tylenol for pain.    UA here shows no infection. GC Chl pending.  Patient  discharged in stable condition with understanding of reasons to return.   Final Clinical Impression(s) / ED Diagnoses Final diagnoses:  Epididymitis  Scrotal pain    Rx / DC Orders ED Discharge Orders         Ordered    doxycycline (VIBRAMYCIN) 100 MG capsule  2 times daily     04/28/20 2245           Gareth Morgan, MD 04/29/20 [phone]

## 2020-04-28 NOTE — ED Triage Notes (Signed)
Patient reports that he has had a vasectomy and states that he has had issues since surgery. Patient states that he is suppose to have an emergency CT scan and surgery. Patient states that Dr. Ronne Binning talked to Dr. Ezzard Standing.

## 2021-07-20 ENCOUNTER — Other Ambulatory Visit: Payer: Self-pay | Admitting: Family Medicine

## 2021-07-20 ENCOUNTER — Other Ambulatory Visit: Payer: Self-pay

## 2021-07-20 ENCOUNTER — Ambulatory Visit
Admission: RE | Admit: 2021-07-20 | Discharge: 2021-07-20 | Disposition: A | Discharge status: Home / Self Care | Payer: Self-pay | Source: Ambulatory Visit | Attending: Family Medicine | Admitting: Family Medicine

## 2021-07-20 DIAGNOSIS — M25562 Pain in left knee: Secondary | ICD-10-CM

## 2021-12-22 IMAGING — CR DG KNEE 3 VIEWS*L*
3 series · 3 of 3 positions shown · non-contrast
Comparison: None.

CLINICAL DATA: Twisted left knee

EXAM:
LEFT KNEE - 3 VIEW

[w knee ap left]
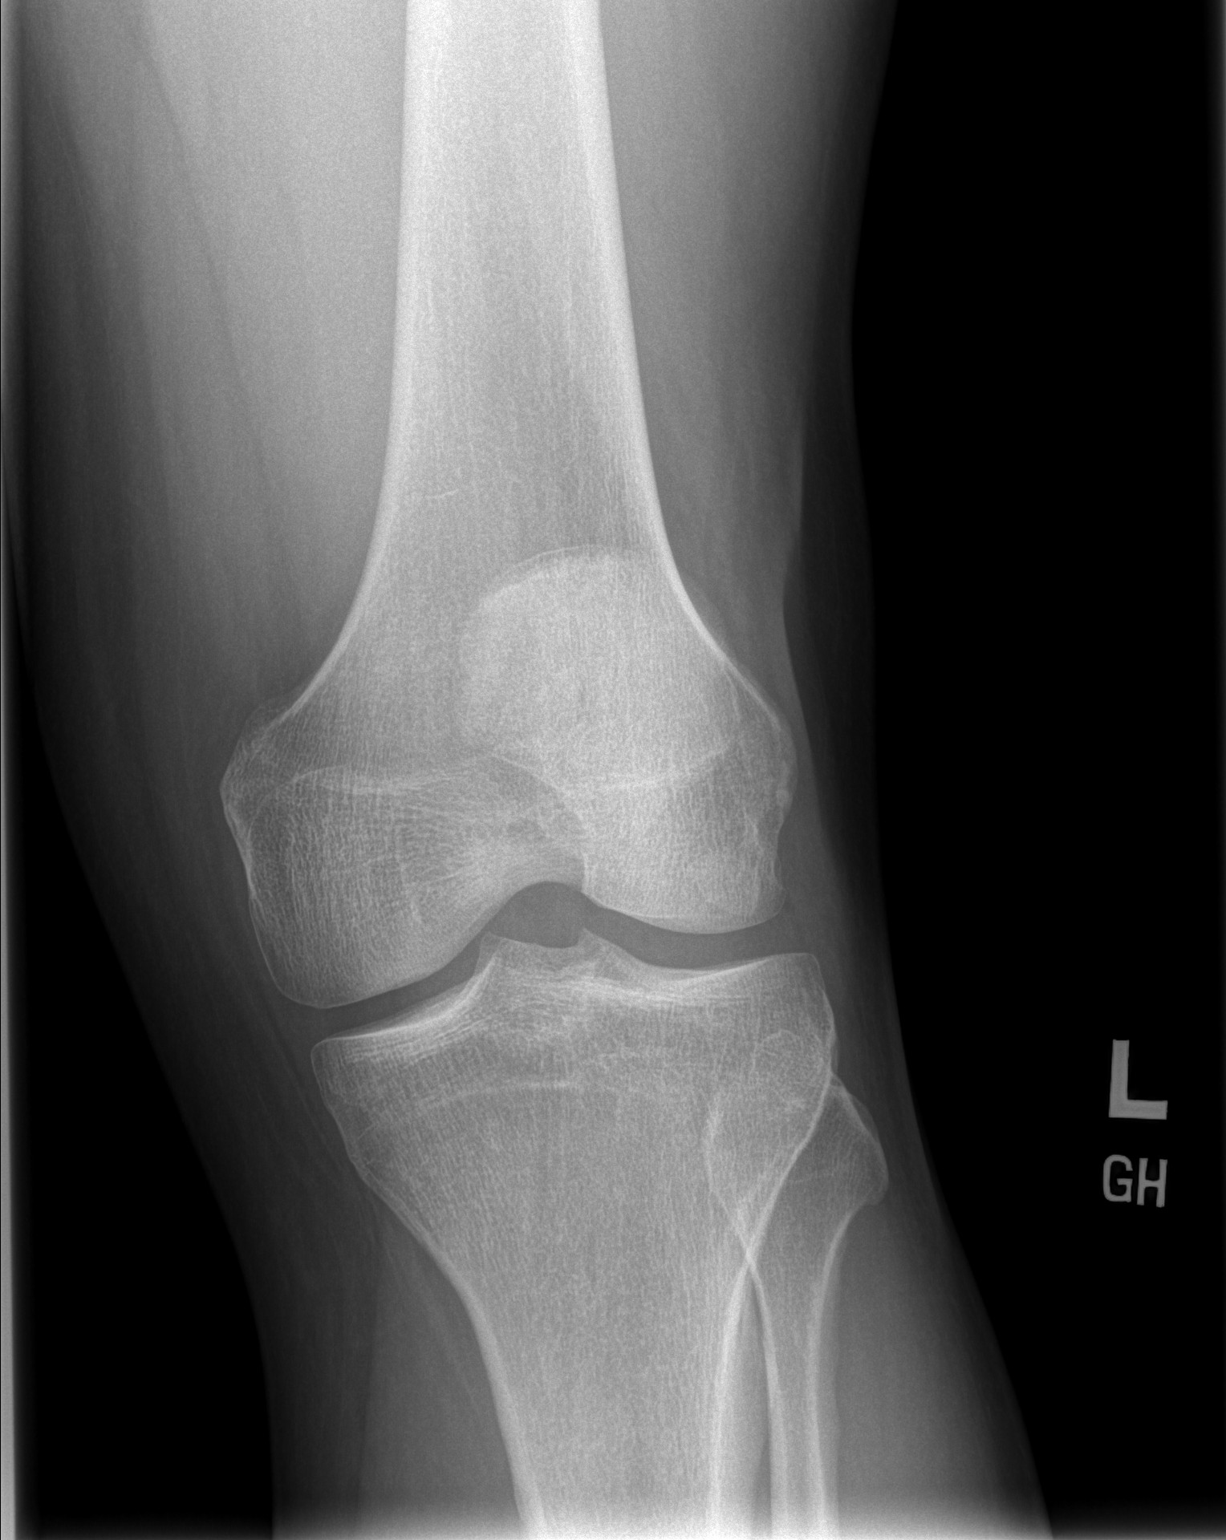

[w knee lat. left]
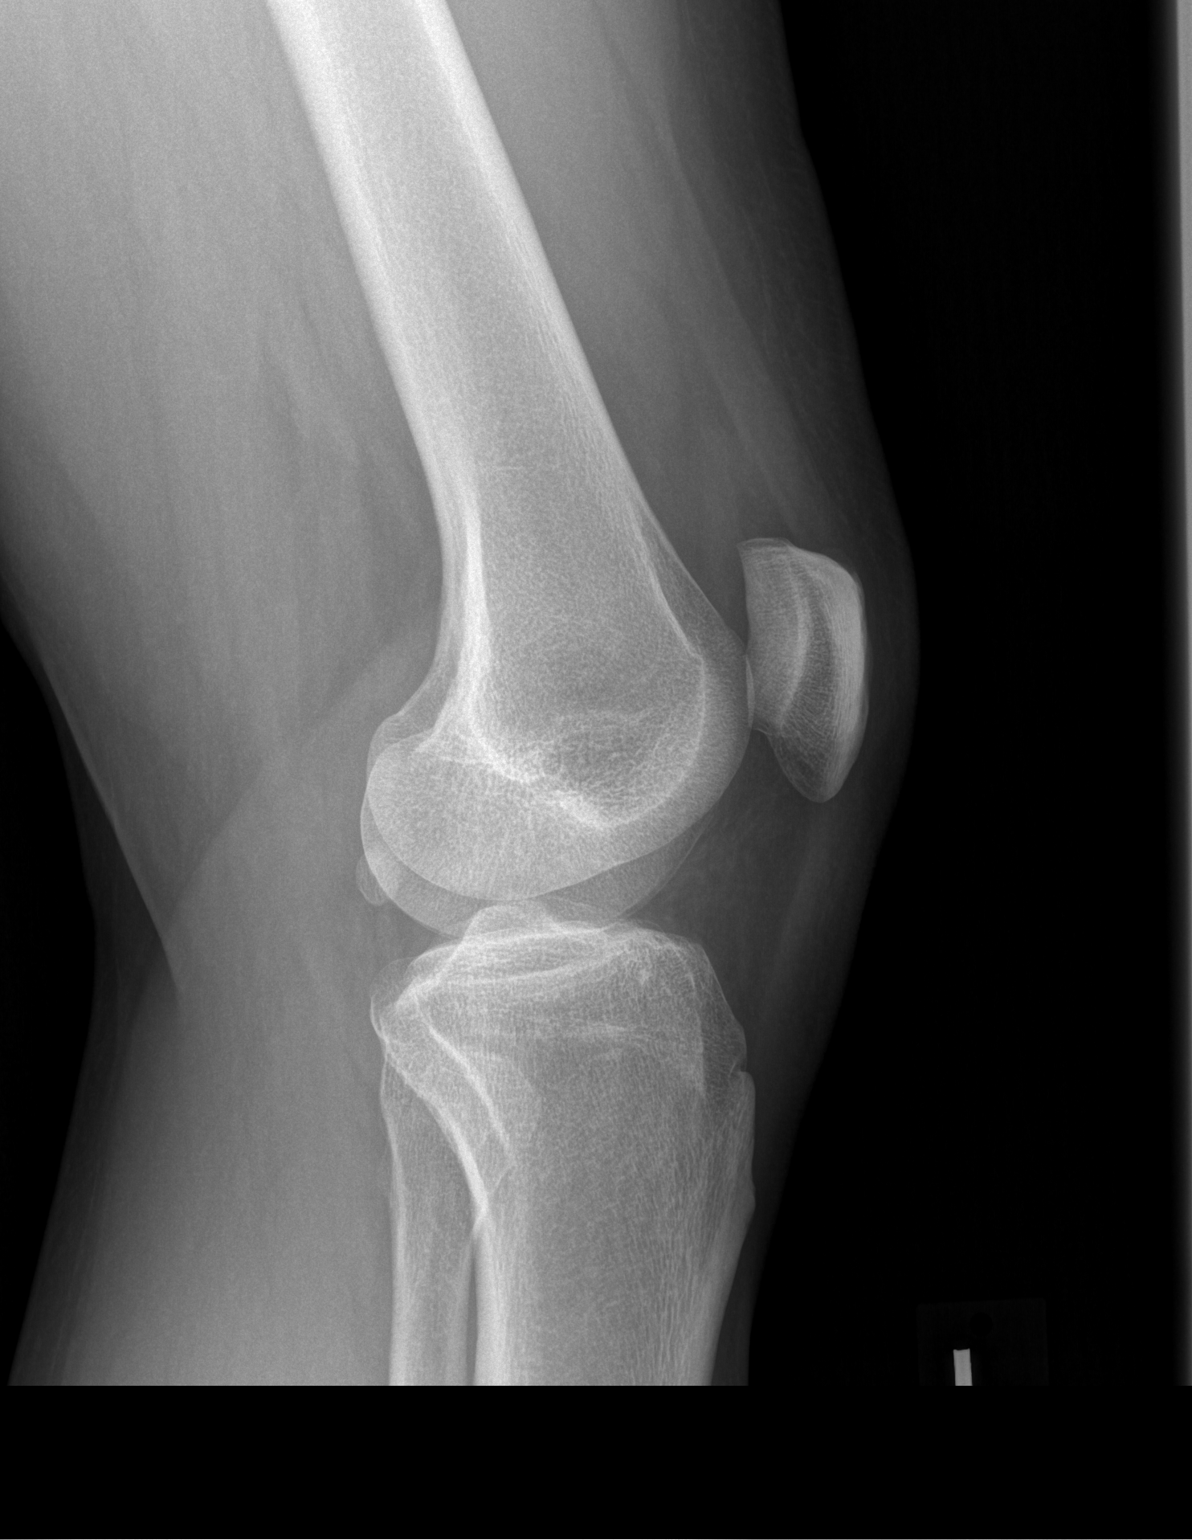

[t knee ap left]
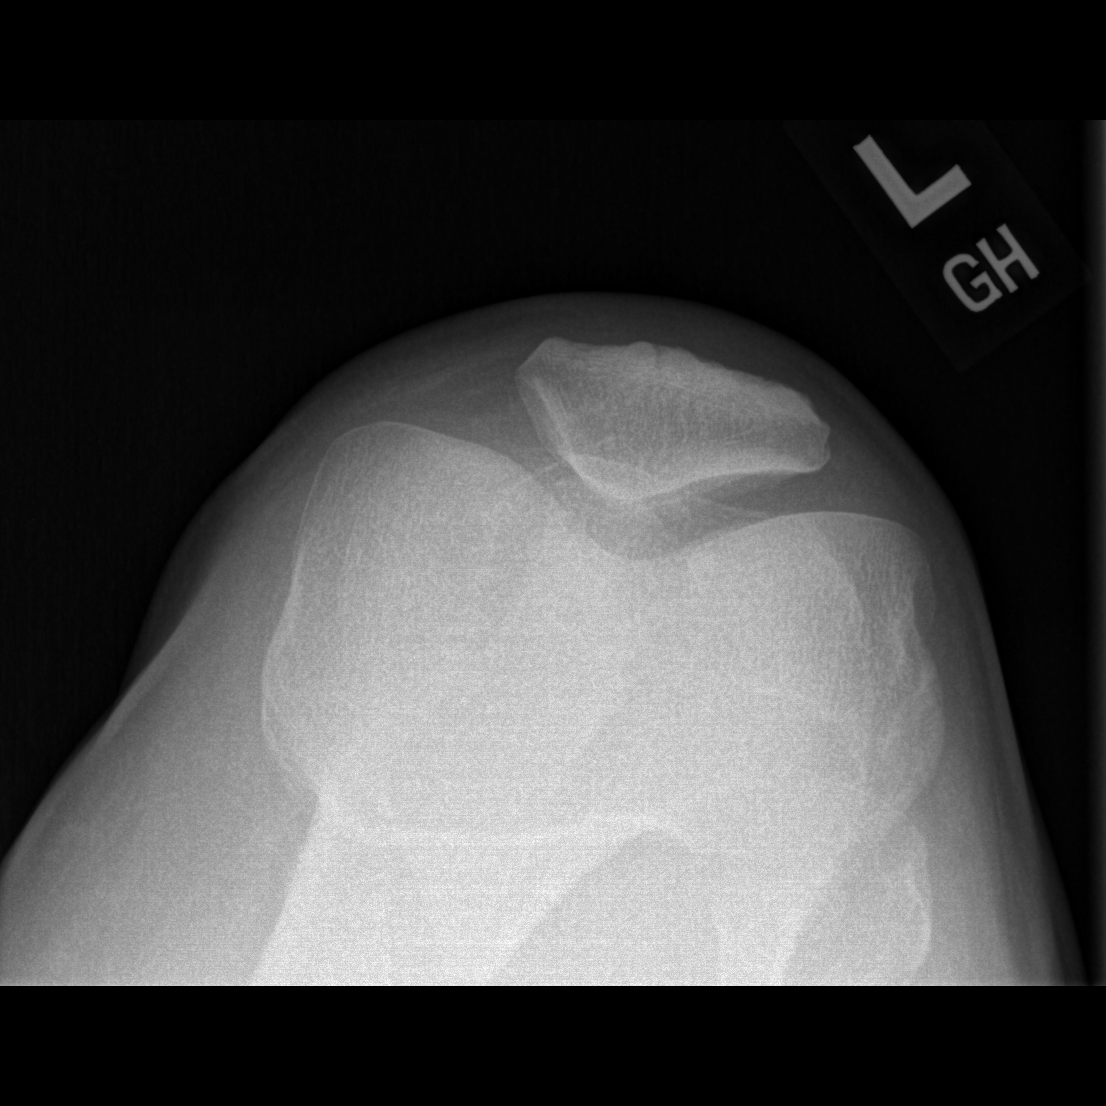

[3 of 3 positions shown; findings below may reference images not displayed]

FINDINGS: There is no acute fracture or dislocation. Alignment is normal. The
joint spaces are preserved. There is a small effusion.
IMPRESSION: No acute fracture or dislocation.  Small suprapatellar effusion.

## 2022-05-08 ENCOUNTER — Emergency Department (HOSPITAL_BASED_OUTPATIENT_CLINIC_OR_DEPARTMENT_OTHER): Payer: 59 | Admitting: Radiology

## 2022-05-08 ENCOUNTER — Other Ambulatory Visit: Payer: Self-pay

## 2022-05-08 ENCOUNTER — Emergency Department (HOSPITAL_BASED_OUTPATIENT_CLINIC_OR_DEPARTMENT_OTHER)
Admission: EM | Admit: 2022-05-08 | Discharge: 2022-05-09 | Disposition: A | Discharge status: Home / Self Care | Payer: 59 | Attending: Emergency Medicine | Admitting: Emergency Medicine

## 2022-05-08 ENCOUNTER — Encounter (HOSPITAL_BASED_OUTPATIENT_CLINIC_OR_DEPARTMENT_OTHER): Payer: Self-pay

## 2022-05-08 DIAGNOSIS — H9203 Otalgia, bilateral: Secondary | ICD-10-CM | POA: Diagnosis not present

## 2022-05-08 DIAGNOSIS — R509 Fever, unspecified: Secondary | ICD-10-CM | POA: Diagnosis present

## 2022-05-08 DIAGNOSIS — J40 Bronchitis, not specified as acute or chronic: Secondary | ICD-10-CM | POA: Insufficient documentation

## 2022-05-08 LAB — BASIC METABOLIC PANEL
Anion gap: 13 (ref 5–15)
BUN: 15 mg/dL (ref 6–20)
CO2: 22 mmol/L (ref 22–32)
Calcium: 9.8 mg/dL (ref 8.9–10.3)
Chloride: 106 mmol/L (ref 98–111)
Creatinine, Ser: 0.95 mg/dL (ref 0.61–1.24)
GFR, Estimated: >60 mL/min (ref >60)
Glucose, Bld: 97 mg/dL (ref 70–99)
Potassium: 4.1 mmol/L (ref 3.5–5.1)
Sodium: 141 mmol/L (ref 135–145)

## 2022-05-08 LAB — CBC
HCT: 42.5 % (ref 39.0–52.0)
Hemoglobin: 14.1 g/dL (ref 13.0–17.0)
MCH: 27.9 pg (ref 26.0–34.0)
MCHC: 33.2 g/dL (ref 30.0–36.0)
MCV: 84.2 fL (ref 80.0–100.0)
Platelets: 212 10*3/uL (ref 150–400)
RBC: 5.05 MIL/uL (ref 4.22–5.81)
RDW: 12.7 % (ref 11.5–15.5)
WBC: 8.6 10*3/uL (ref 4.0–10.5)
nRBC: 0 % (ref 0.0–0.2)

## 2022-05-08 LAB — TROPONIN I (HIGH SENSITIVITY): Troponin I (High Sensitivity): 3 ng/L (ref <18)

## 2022-05-08 MED ORDER — DEXAMETHASONE SODIUM PHOSPHATE 10 MG/ML IJ SOLN: 10.0000 mg | Freq: Once | INTRAMUSCULAR | Status: DC

## 2022-05-08 MED ORDER — PREDNISONE 20 MG PO TABS: 40.0000 mg | ORAL_TABLET | Freq: Every day | ORAL | 0 refills | Status: DC

## 2022-05-08 MED ORDER — GUAIFENESIN-CODEINE 100-10 MG/5ML PO SOLN: 10.0000 mL | Freq: Every evening | ORAL | 0 refills | Status: DC | PRN

## 2022-05-08 MED ORDER — ALBUTEROL SULFATE HFA 108 (90 BASE) MCG/ACT IN AERS
2.0000 | INHALATION_SPRAY | RESPIRATORY_TRACT | Status: DC | PRN
  Administered 2022-05-08: 2 via RESPIRATORY_TRACT
  Filled 2022-05-08: qty 6.7

## 2022-05-08 MED ORDER — GUAIFENESIN-CODEINE 100-10 MG/5ML PO SOLN: 10.0000 mL | Freq: Every evening | ORAL | 0 refills | Status: AC | PRN

## 2022-05-08 MED ORDER — GUAIFENESIN-CODEINE 100-10 MG/5ML PO SOLN
10.0000 mL | Freq: Once | ORAL | Status: AC
  Administered 2022-05-08: 10 mL via ORAL
  Filled 2022-05-08: qty 10

## 2022-05-08 MED ORDER — DEXAMETHASONE SODIUM PHOSPHATE 10 MG/ML IJ SOLN
10.0000 mg | Freq: Once | INTRAMUSCULAR | Status: AC
  Administered 2022-05-08: 10 mg via INTRAMUSCULAR
  Filled 2022-05-08: qty 1

## 2022-05-08 NOTE — ED Provider Notes (Signed)
[facility] EMERGENCY DEPT Provider Note   CSN: [id number] Arrival date & time: 05/08/22  1906     History  Chief Complaint  Patient presents with   Shortness of Breath   Fever    Derrick Krueger is a 36 y.o. male.  Patient presents to the emergency department with complaints of intermittent fever, cough, bilateral ear pain, shortness of breath.  Patient reports that symptoms began 2 weeks ago.  He has tried to use albuterol but reports that it made him feel more short of breath.  He has had intermittent episodes of feeling very short of breath, not sure if he is having some panic attacks.      Home Medications Prior to Admission medications   Medication Sig Start Date End Date Taking? Authorizing Provider  guaiFENesin-codeine 100-10 MG/5ML syrup Take 10 mLs by mouth at bedtime as needed for cough. 05/08/22  Yes Dhaval Woo, Canary Brim, MD  predniSONE (DELTASONE) 20 MG tablet Take 2 tablets (40 mg total) by mouth daily with breakfast. 05/08/22  Yes Fares Ramthun, Canary Brim, MD      Allergies    Hydrocodone    Review of Systems   Review of Systems  Physical Exam Updated Vital Signs BP (!) 133/94   Pulse 82   Temp 98.1 F (36.7 C)   Resp (!) 26   Ht 5\' 11"  (1.803 m)   Wt 90.7 kg   SpO2 100%   BMI 27.89 kg/m  Physical Exam Vitals and nursing note reviewed.  Constitutional:      General: He is not in acute distress.    Appearance: He is well-developed.  HENT:     Head: Normocephalic and atraumatic.     Right Ear: Tympanic membrane and ear canal normal.     Left Ear: Tympanic membrane and ear canal normal.     Mouth/Throat:     Mouth: Mucous membranes are moist.  Eyes:     General: Vision grossly intact. Gaze aligned appropriately.     Extraocular Movements: Extraocular movements intact.     Conjunctiva/sclera: Conjunctivae normal.  Cardiovascular:     Rate and Rhythm: Normal rate and regular rhythm.     Pulses: Normal pulses.     Heart sounds:  Normal heart sounds, S1 normal and S2 normal. No murmur heard.   No friction rub. No gallop.  Pulmonary:     Effort: Pulmonary effort is normal. No respiratory distress.     Breath sounds: Normal breath sounds.  Abdominal:     Palpations: Abdomen is soft.     Tenderness: There is no abdominal tenderness. There is no guarding or rebound.     Hernia: No hernia is present.  Musculoskeletal:        General: No swelling.     Cervical back: Full passive range of motion without pain, normal range of motion and neck supple. No pain with movement, spinous process tenderness or muscular tenderness. Normal range of motion.     Right lower leg: No edema.     Left lower leg: No edema.  Skin:    General: Skin is warm and dry.     Capillary Refill: Capillary refill takes less than 2 seconds.     Findings: No ecchymosis, erythema, lesion or wound.  Neurological:     Mental Status: He is alert and oriented to person, place, and time.     GCS: GCS eye subscore is 4. GCS verbal subscore is 5. GCS motor subscore is 6.  Cranial Nerves: Cranial nerves 2-12 are intact.     Sensory: Sensation is intact.     Motor: Motor function is intact. No weakness or abnormal muscle tone.     Coordination: Coordination is intact.  Psychiatric:        Mood and Affect: Mood normal.        Speech: Speech normal.        Behavior: Behavior normal.    ED Results / Procedures / Treatments   Labs (all labs ordered are listed, but only abnormal results are displayed) Labs Reviewed  BASIC METABOLIC PANEL  CBC  TROPONIN I (HIGH SENSITIVITY)    EKG EKG Interpretation  Date/Time:  Tuesday May 08 2022 19:45:43 EDT Ventricular Rate:  83 PR Interval:  158 QRS Duration: 102 QT Interval:  344 QTC Calculation: 404 R Axis:   52 Text Interpretation: Normal sinus rhythm Normal ECG No previous ECGs available agree Confirmed by Arby Barrette [phone]) on 05/08/2022 10:10:05 PM  Radiology DG Chest 2 View  Result Date:  05/08/2022 CLINICAL DATA:  Shortness of breath EXAM: CHEST - 2 VIEW COMPARISON:  10/03/2021 FINDINGS: Minimal atelectasis at the bases. No consolidation or effusion. Normal cardiac size. No pneumothorax. IMPRESSION: No active cardiopulmonary disease. Electronically Signed   By: Jasmine Pang M.D.   On: 05/08/2022 20:16    Procedures Procedures    Medications Ordered in ED Medications  albuterol (VENTOLIN HFA) 108 (90 Base) MCG/ACT inhaler 2 puff (has no administration in time range)  dexamethasone (DECADRON) injection 10 mg (has no administration in time range)  guaiFENesin-codeine 100-10 MG/5ML solution 10 mL (has no administration in time range)    ED Course/ Medical Decision Making/ A&P                           Medical Decision Making Amount and/or Complexity of Data Reviewed Labs: ordered. Radiology: ordered.  Risk OTC drugs. Prescription drug management.   Patient with 2 weeks of URI symptoms.  Patient with cough productive of yellow and green sputum.  He has had persistent cough, worse at night.  This causes him to be short of breath.  It sounds as though he is describing intermittent episodes of bronchospasm.  He is not wheezing currently, breathing comfortably and lungs are clear on auscultation.  Remainder of exam is normal.  Work-up reassuring.  Chest x-ray does not show evidence of pneumonia.  All blood work is unremarkable.  Patient does not appear to have any evidence of bacterial infection.  He is likely experiencing continued bronchitis symptoms after original URI.  We will continue albuterol, treat with some steroid course to see if it helps with his breathing.  He is not in any distress, no hypoxia, does not require hospitalization.        Final Clinical Impression(s) / ED Diagnoses Final diagnoses:  Bronchitis    Rx / DC Orders ED Discharge Orders          Ordered    guaiFENesin-codeine 100-10 MG/5ML syrup  At bedtime PRN        05/08/22 2335     predniSONE (DELTASONE) 20 MG tablet  Daily with breakfast        05/08/22 2335              Gilda Crease, MD 05/08/22 [phone]

## 2022-05-08 NOTE — ED Notes (Signed)
ED Provider at bedside. 

## 2022-05-08 NOTE — ED Triage Notes (Signed)
Pt reports that he started running fevers, BIL ear pain, cough, and SHOB since Sunday. Pt denies chest pain.

## 2022-10-10 IMAGING — DX DG CHEST 2V
2 series · 2 of 2 positions shown · non-contrast
Comparison: 10/03/2021

CLINICAL DATA: Shortness of breath

EXAM:
CHEST - 2 VIEW

[chest pa]
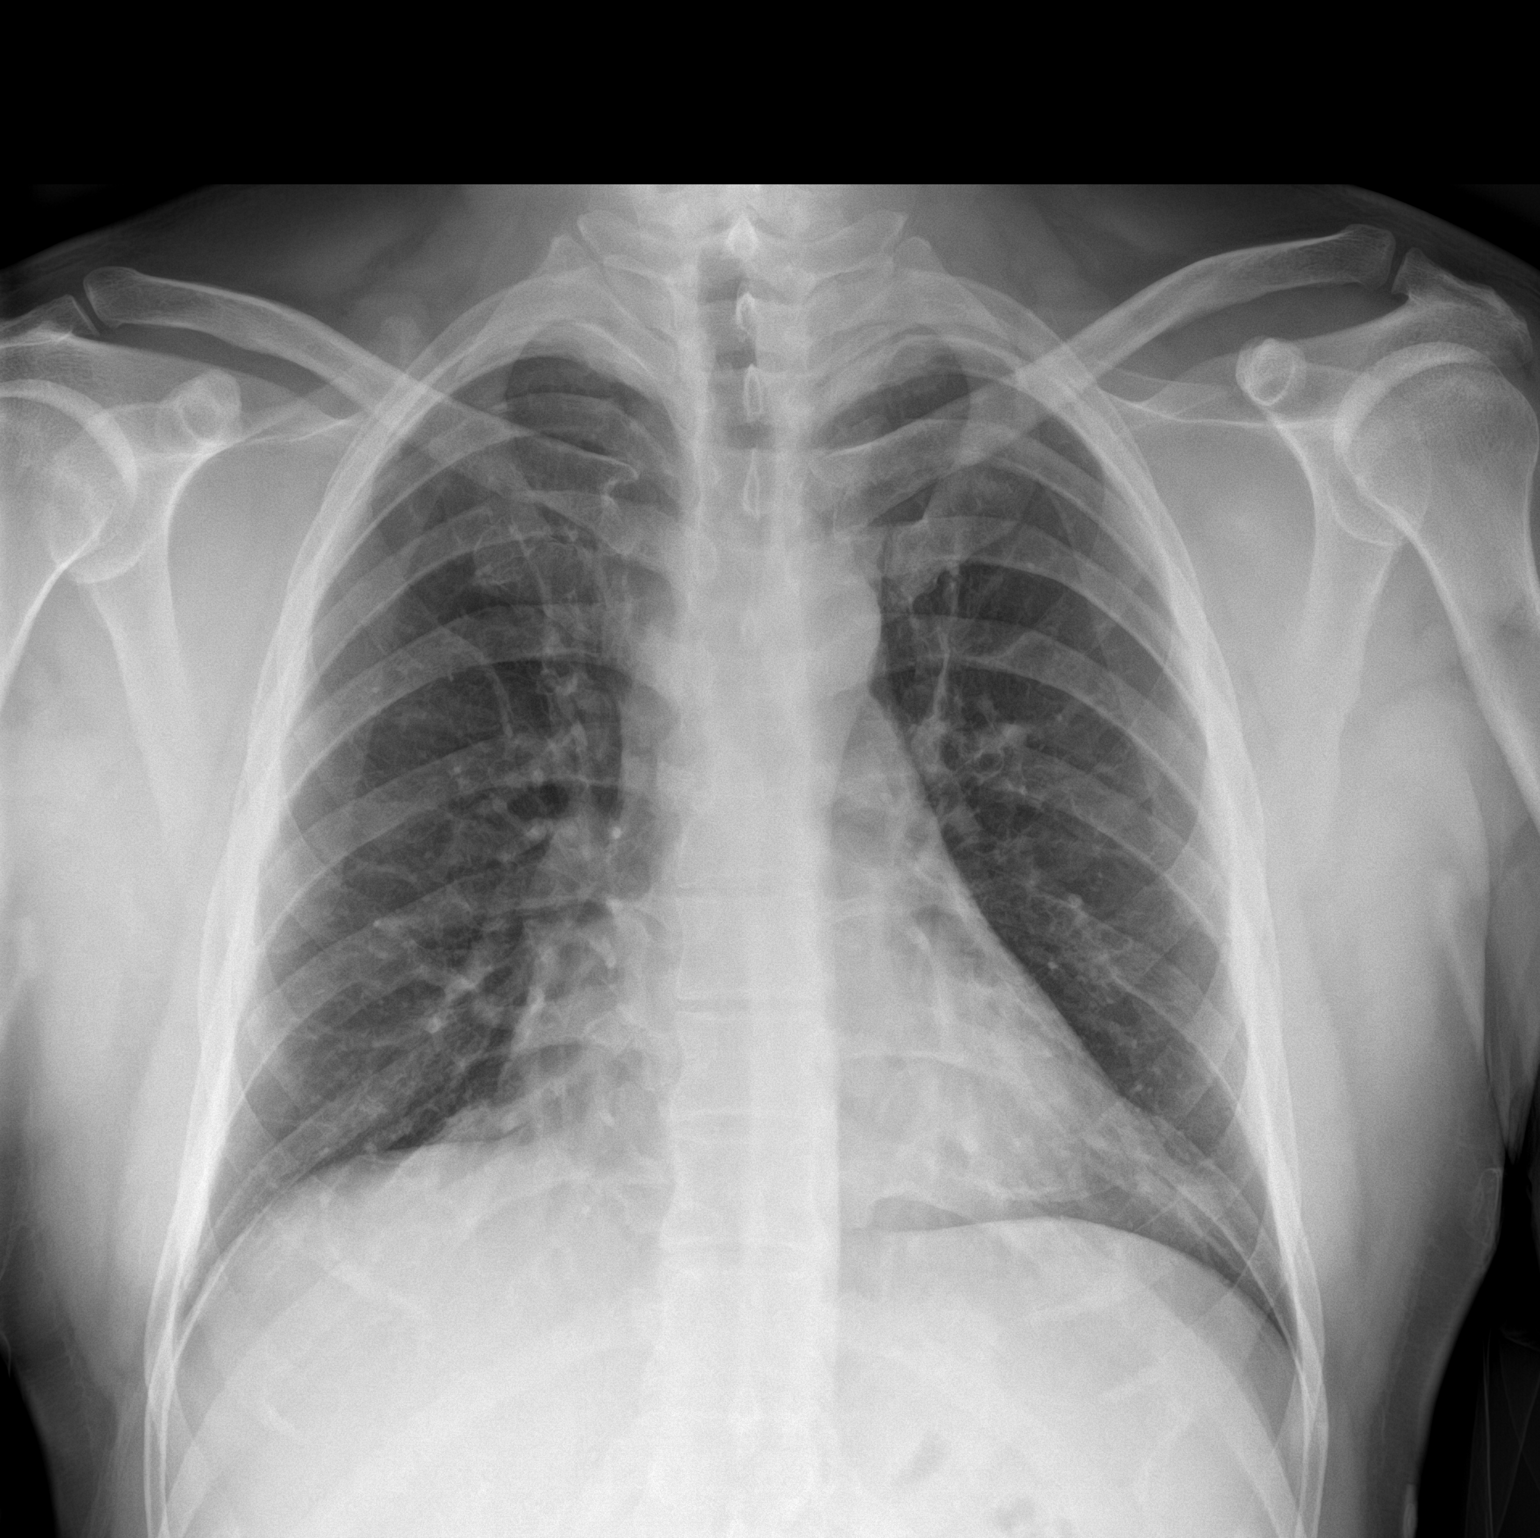

[chest lat]
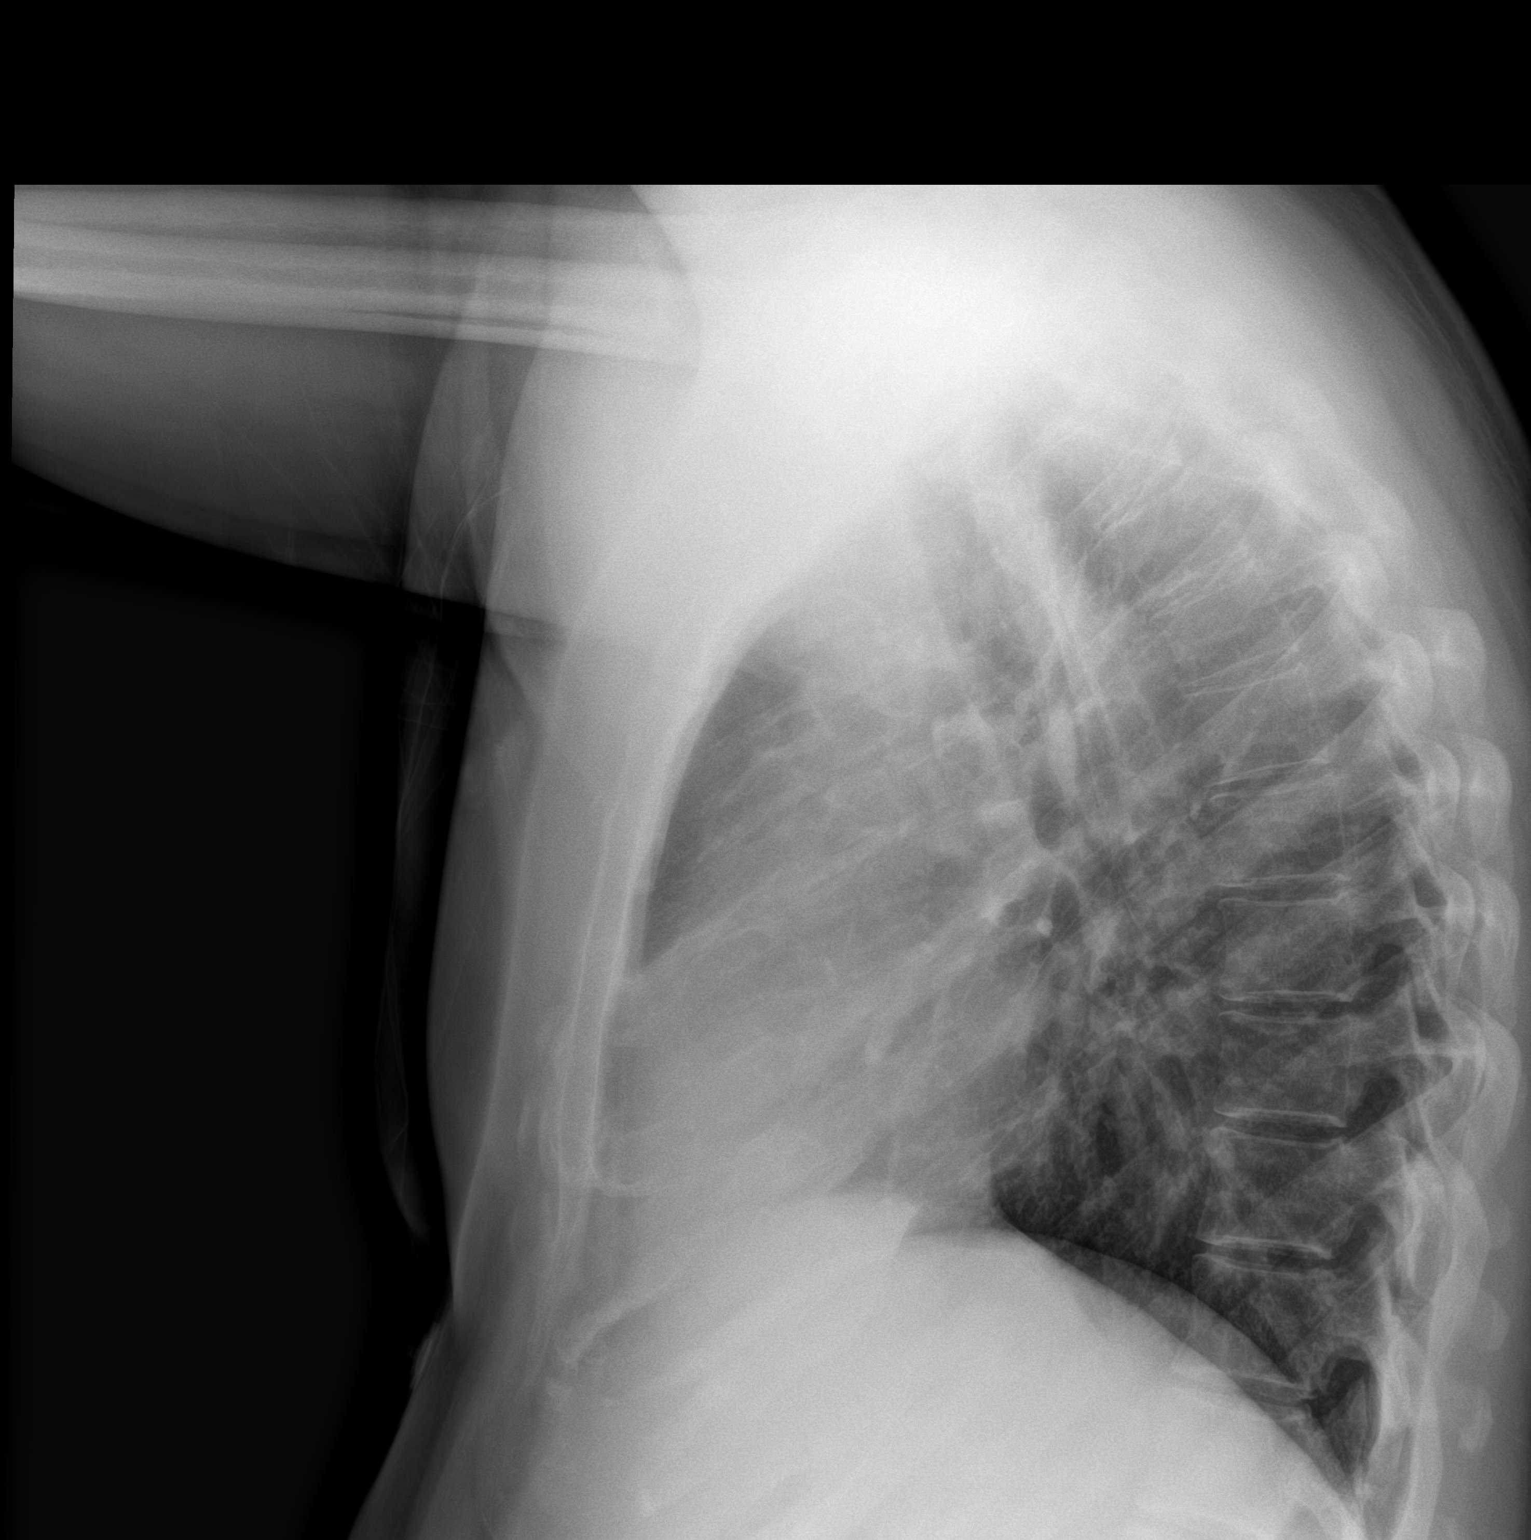

[2 of 2 positions shown; findings below may reference images not displayed]

FINDINGS: Minimal atelectasis at the bases. No consolidation or effusion.
Normal cardiac size. No pneumothorax.
IMPRESSION: No active cardiopulmonary disease.

## 2023-12-14 ENCOUNTER — Emergency Department (HOSPITAL_COMMUNITY): Payer: Worker's Compensation

## 2023-12-14 ENCOUNTER — Other Ambulatory Visit: Payer: Self-pay

## 2023-12-14 ENCOUNTER — Encounter (HOSPITAL_COMMUNITY): Payer: Self-pay

## 2023-12-14 ENCOUNTER — Emergency Department (HOSPITAL_COMMUNITY)
Admission: EM | Admit: 2023-12-14 | Discharge: 2023-12-14 | Disposition: A | Discharge status: Home / Self Care | Payer: Worker's Compensation | Attending: Emergency Medicine | Admitting: Emergency Medicine

## 2023-12-14 DIAGNOSIS — T68XXXA Hypothermia, initial encounter: Secondary | ICD-10-CM | POA: Insufficient documentation

## 2023-12-14 DIAGNOSIS — X31XXXA Exposure to excessive natural cold, initial encounter: Secondary | ICD-10-CM | POA: Diagnosis not present

## 2023-12-14 DIAGNOSIS — R000 Tachycardia, unspecified: Secondary | ICD-10-CM | POA: Insufficient documentation

## 2023-12-14 DIAGNOSIS — J45909 Unspecified asthma, uncomplicated: Secondary | ICD-10-CM | POA: Insufficient documentation

## 2023-12-14 DIAGNOSIS — T699XXA Effect of reduced temperature, unspecified, initial encounter: Secondary | ICD-10-CM | POA: Diagnosis present

## 2023-12-14 LAB — I-STAT CHEM 8, ED
BUN: 21 mg/dL — ABNORMAL HIGH (ref 6–20)
Calcium, Ion: 1.19 mmol/L (ref 1.15–1.40)
Chloride: 105 mmol/L (ref 98–111)
Creatinine, Ser: 1.4 mg/dL — ABNORMAL HIGH (ref 0.61–1.24)
Glucose, Bld: 133 mg/dL — ABNORMAL HIGH (ref 70–99)
HCT: 49 % (ref 39.0–52.0)
Hemoglobin: 16.7 g/dL (ref 13.0–17.0)
Potassium: 4.2 mmol/L (ref 3.5–5.1)
Sodium: 141 mmol/L (ref 135–145)
TCO2: 21 mmol/L — ABNORMAL LOW (ref 22–32)

## 2023-12-14 LAB — CBC
HCT: 49.1 % (ref 39.0–52.0)
Hemoglobin: 16.5 g/dL (ref 13.0–17.0)
MCH: 28.1 pg (ref 26.0–34.0)
MCHC: 33.6 g/dL (ref 30.0–36.0)
MCV: 83.6 fL (ref 80.0–100.0)
Platelets: 284 10*3/uL (ref 150–400)
RBC: 5.87 MIL/uL — ABNORMAL HIGH (ref 4.22–5.81)
RDW: 12.4 % (ref 11.5–15.5)
WBC: 10.7 10*3/uL — ABNORMAL HIGH (ref 4.0–10.5)
nRBC: 0 % (ref 0.0–0.2)

## 2023-12-14 LAB — COMPREHENSIVE METABOLIC PANEL
ALT: 49 U/L — ABNORMAL HIGH (ref 0–44)
AST: 55 U/L — ABNORMAL HIGH (ref 15–41)
Albumin: 4.6 g/dL (ref 3.5–5.0)
Alkaline Phosphatase: 71 U/L (ref 38–126)
Anion gap: 19 — ABNORMAL HIGH (ref 5–15)
BUN: 18 mg/dL (ref 6–20)
CO2: 19 mmol/L — ABNORMAL LOW (ref 22–32)
Calcium: 10.2 mg/dL (ref 8.9–10.3)
Chloride: 102 mmol/L (ref 98–111)
Creatinine, Ser: 1.41 mg/dL — ABNORMAL HIGH (ref 0.61–1.24)
GFR, Estimated: >60 mL/min (ref >60)
Glucose, Bld: 141 mg/dL — ABNORMAL HIGH (ref 70–99)
Potassium: 4.1 mmol/L (ref 3.5–5.1)
Sodium: 140 mmol/L (ref 135–145)
Total Bilirubin: 0.9 mg/dL (ref 0.0–1.2)
Total Protein: 7.4 g/dL (ref 6.5–8.1)

## 2023-12-14 LAB — I-STAT CG4 LACTIC ACID, ED: Lactic Acid, Venous: 8.4 mmol/L (ref 0.5–1.9)

## 2023-12-14 MED ORDER — SODIUM CHLORIDE 0.9 % IV BOLUS
1000.0000 mL | Freq: Once | INTRAVENOUS | Status: AC
  Administered 2023-12-14: 1000 mL via INTRAVENOUS

## 2023-12-14 MED ORDER — ALBUTEROL SULFATE HFA 108 (90 BASE) MCG/ACT IN AERS
2.0000 | INHALATION_SPRAY | RESPIRATORY_TRACT | Status: DC
  Administered 2023-12-14: 2 via RESPIRATORY_TRACT
  Filled 2023-12-14: qty 6.7
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
